# Patient Record
Sex: Male | Born: 1960 | Race: White | Hispanic: No | Marital: Married | Smoking: Former smoker
Health system: Southern US, Community
[De-identification: ages and names within clinical notes are randomized; demographics above are authoritative.]

## PROBLEM LIST (undated history)

## (undated) DIAGNOSIS — D126 Benign neoplasm of colon, unspecified: Secondary | ICD-10-CM

## (undated) DIAGNOSIS — I10 Essential (primary) hypertension: Secondary | ICD-10-CM

## (undated) DIAGNOSIS — K760 Fatty (change of) liver, not elsewhere classified: Secondary | ICD-10-CM

## (undated) DIAGNOSIS — K5790 Diverticulosis of intestine, part unspecified, without perforation or abscess without bleeding: Secondary | ICD-10-CM

## (undated) DIAGNOSIS — G43909 Migraine, unspecified, not intractable, without status migrainosus: Secondary | ICD-10-CM

## (undated) DIAGNOSIS — I2510 Atherosclerotic heart disease of native coronary artery without angina pectoris: Secondary | ICD-10-CM

## (undated) HISTORY — PX: PROSTATE SURGERY: SHX751

## (undated) HISTORY — DX: Diverticulosis of intestine, part unspecified, without perforation or abscess without bleeding: K57.90

## (undated) HISTORY — DX: Fatty (change of) liver, not elsewhere classified: K76.0

## (undated) HISTORY — DX: Atherosclerotic heart disease of native coronary artery without angina pectoris: I25.10

## (undated) HISTORY — PX: ADENOIDECTOMY: SUR15

## (undated) HISTORY — PX: HERNIA REPAIR: SHX51

## (undated) HISTORY — DX: Migraine, unspecified, not intractable, without status migrainosus: G43.909

## (undated) HISTORY — PX: VASECTOMY: SHX75

## (undated) HISTORY — DX: Benign neoplasm of colon, unspecified: D12.6

## (undated) HISTORY — DX: Essential (primary) hypertension: I10

---

## 1992-08-28 HISTORY — PX: ANKLE SURGERY: SHX546

## 2001-08-05 ENCOUNTER — Encounter: Payer: Self-pay | Admitting: General Surgery

## 2001-08-05 ENCOUNTER — Encounter: Admission: RE | Admit: 2001-08-05 | Discharge: 2001-08-05 | Payer: Self-pay | Admitting: General Surgery

## 2004-01-20 ENCOUNTER — Encounter: Admission: RE | Admit: 2004-01-20 | Discharge: 2004-01-20 | Payer: Self-pay | Admitting: Otolaryngology

## 2006-06-18 ENCOUNTER — Ambulatory Visit (HOSPITAL_COMMUNITY): Admission: RE | Admit: 2006-06-18 | Discharge: 2006-06-18 | Payer: Self-pay | Admitting: Chiropractic Medicine

## 2007-03-17 ENCOUNTER — Ambulatory Visit (HOSPITAL_COMMUNITY): Admission: RE | Admit: 2007-03-17 | Discharge: 2007-03-17 | Payer: Self-pay | Admitting: Family Medicine

## 2009-07-06 ENCOUNTER — Ambulatory Visit: Payer: Self-pay | Admitting: Cardiology

## 2009-07-06 DIAGNOSIS — I10 Essential (primary) hypertension: Secondary | ICD-10-CM | POA: Insufficient documentation

## 2009-07-06 DIAGNOSIS — R072 Precordial pain: Secondary | ICD-10-CM | POA: Insufficient documentation

## 2009-07-06 DIAGNOSIS — E669 Obesity, unspecified: Secondary | ICD-10-CM | POA: Insufficient documentation

## 2009-08-02 ENCOUNTER — Ambulatory Visit: Payer: Self-pay | Admitting: Cardiology

## 2009-08-02 ENCOUNTER — Ambulatory Visit: Payer: Self-pay

## 2010-07-05 ENCOUNTER — Encounter: Admission: RE | Admit: 2010-07-05 | Discharge: 2010-07-05 | Payer: Self-pay | Admitting: Sports Medicine

## 2010-09-17 ENCOUNTER — Encounter: Payer: Self-pay | Admitting: Sports Medicine

## 2010-09-29 NOTE — Letter (Signed)
Summary: [facility]  [facility]   Imported By: Kassie Mends 09/13/2009 08:31:29  _____________________________________________________________________  External Attachment:    Type:   Image     Comment:   External Document

## 2012-01-23 ENCOUNTER — Other Ambulatory Visit: Payer: Self-pay | Admitting: Physician Assistant

## 2012-01-23 MED ORDER — HYDROCHLOROTHIAZIDE 25 MG PO TABS: 25.0000 mg | ORAL_TABLET | Freq: Every day | ORAL | Status: DC

## 2012-10-25 ENCOUNTER — Ambulatory Visit (INDEPENDENT_AMBULATORY_CARE_PROVIDER_SITE_OTHER): Payer: BC Managed Care – PPO | Admitting: Family Medicine

## 2012-10-25 VITALS — BP 144/92 | HR 65 | Temp 98.0°F | Resp 16 | Ht 71.0 in | Wt 226.0 lb

## 2012-10-25 DIAGNOSIS — J029 Acute pharyngitis, unspecified: Secondary | ICD-10-CM

## 2012-10-25 DIAGNOSIS — J309 Allergic rhinitis, unspecified: Secondary | ICD-10-CM

## 2012-10-25 MED ORDER — FLUTICASONE PROPIONATE 50 MCG/ACT NA SUSP: 2.0000 | Freq: Every day | NASAL | Status: DC

## 2012-10-25 MED ORDER — CEFDINIR 300 MG PO CAPS: 300.0000 mg | ORAL_CAPSULE | Freq: Two times a day (BID) | ORAL | Status: DC

## 2012-10-25 NOTE — Patient Instructions (Addendum)
Use the flonase as needed for your symptoms, you may also try an OTC antihistamine such as Claritin or zyrtec.  If you are not better in the next few days please feel free to give me a call- at that time you could also start the omnicef rx if you like.     Let me know if you are getting worse or developing any other symptoms or a fever.

## 2012-10-25 NOTE — Progress Notes (Signed)
[facility] [address], [place] [address] [phone]  Date:  10/25/2012   Name:  Henry Wallace   DOB:  April 15, 1961   MRN:  [id number]  PCP:  No primary provider on file.    Chief Complaint: Sore Throat   History of Present Illness:  Henry Wallace is a 52 y.o. very pleasant male patient who presents with the following:  He is here with a ST that has been getting worse over he last 5 days.  He has tried to deal with this at home, but it is now harder to swallow and talk due to pain. Ears feel "tickly"  He has a mild cough- it comes and goes.  He is not aware of any fever, chills or aches "If my throat didn't hurt I wouldn't know I was sick."  He has noted some sinus drainage.  No particular nasal symtpoms.   Not taking any medications for his illness except for cough drops He has lost about 30 lbs through diet and exercise  Patient Active Problem List  Diagnosis  . OBESITY, UNSPECIFIED  . HYPERTENSION, BENIGN  . CHEST PAIN, PRECORDIAL    Past Medical History  Diagnosis Date  . Hypertension     Past Surgical History  Procedure Laterality Date  . Hernia repair    . Vasectomy    . Prostate surgery      History  Substance Use Topics  . Smoking status: Never Smoker   . Smokeless tobacco: Not on file  . Alcohol Use: Not on file    History reviewed. No pertinent family history.  No Known Allergies  Medication list has been reviewed and updated.  Current Outpatient Prescriptions on File Prior to Visit  Medication Sig Dispense Refill  . hydrochlorothiazide (HYDRODIURIL) 25 MG tablet Take 1 tablet (25 mg total) by mouth daily.  90 tablet  0   No current facility-administered medications on file prior to visit.    Review of Systems:  As per HPI- otherwise negative.   Physical Examination: Filed Vitals:   10/25/12 1223  BP: 144/92  Pulse: 65  Temp: 98 F (36.7 C)  Resp: 16   Filed Vitals:   10/25/12 1223  Height: 5'  11" (1.803 m)  Weight: 226 lb (102.513 kg)   Body mass index is 31.53 kg/(m^2). Ideal Body Weight: Weight in (lb) to have BMI = 25: 178.9  GEN: WDWN, NAD, Non-toxic, A & O x 3 HEENT: Atraumatic, Normocephalic. Neck supple. No masses, No LAD. Bilateral TM wnl, oropharynx normal.  PEERL,EOMI.   Nasal cavity quite inflamed and slightly congested Ears and Nose: No external deformity. CV: RRR, No M/G/R. No JVD. No thrill. No extra heart sounds. PULM: CTA B, no wheezes, crackles, rhonchi. No retractions. No resp. distress. No accessory muscle use. EXTR: No c/c/e NEURO Normal gait.  PSYCH: Normally interactive. Conversant. Not depressed or anxious appearing.  Calm demeanor.   Results for orders placed in visit on 10/25/12  POCT RAPID STREP A (OFFICE)      Result Value Range   Rapid Strep A Screen Negative  Negative    Assessment and Plan: Acute pharyngitis - Plan: POCT rapid strep A, cefdinir (OMNICEF) 300 MG capsule  Allergic rhinitis - Plan: fluticasone (FLONASE) 50 MCG/ACT nasal spray  Likely viral URI vs allergies.  Will try flonase and OTC claritin or zyrtec- if not better may fill and try omnicef rx  See patient instructions for more details.  Lamar Blinks, MD

## 2013-07-22 ENCOUNTER — Ambulatory Visit (INDEPENDENT_AMBULATORY_CARE_PROVIDER_SITE_OTHER): Payer: BC Managed Care – PPO | Admitting: Family Medicine

## 2013-07-22 ENCOUNTER — Ambulatory Visit: Payer: BC Managed Care – PPO

## 2013-07-22 VITALS — BP 138/90 | HR 66 | Temp 98.7°F | Resp 18 | Ht 70.5 in | Wt 224.8 lb

## 2013-07-22 DIAGNOSIS — S59902S Unspecified injury of left elbow, sequela: Secondary | ICD-10-CM

## 2013-07-22 DIAGNOSIS — IMO0002 Reserved for concepts with insufficient information to code with codable children: Secondary | ICD-10-CM

## 2013-07-22 DIAGNOSIS — IMO0001 Reserved for inherently not codable concepts without codable children: Secondary | ICD-10-CM

## 2013-07-22 DIAGNOSIS — F411 Generalized anxiety disorder: Secondary | ICD-10-CM

## 2013-07-22 DIAGNOSIS — T148XXA Other injury of unspecified body region, initial encounter: Secondary | ICD-10-CM

## 2013-07-22 DIAGNOSIS — Z23 Encounter for immunization: Secondary | ICD-10-CM

## 2013-07-22 MED ORDER — CITALOPRAM HYDROBROMIDE 20 MG PO TABS: 20.0000 mg | ORAL_TABLET | Freq: Every day | ORAL | Status: DC

## 2013-07-22 MED ORDER — CLONAZEPAM 0.5 MG PO TABS: 0.5000 mg | ORAL_TABLET | Freq: Three times a day (TID) | ORAL | Status: DC | PRN

## 2013-07-22 NOTE — Progress Notes (Signed)
[facility] [address], [place] [address] [phone]  Date:  07/22/2013   Name:  Henry Wallace   DOB:  1961-08-07   MRN:  [id number]  PCP:  No primary provider on file.    Chief Complaint: Anxiety, Elbow Pain and Other   History of Present Illness:  Henry Wallace is a 52 y.o. very pleasant male patient who presents with the following:  He is here today with a few issues- he planned to see [place] for an elbow problem, but then "I was feeling all these emotions and I was not sure how to handle it."   "I am falling flat apart."  He has felt this way for a few weeks.   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  He is frightened by his current feeling of loss of control and uncertainty. These feelings stem from current issues in his marriage  His wife Henry Wallace is an accomplished Engineer, structural.  They started lessons together a couple of years ago.  However he started having some joint issues which limited his participation in dance.  Henry Wallace has continued dancing on her own; her involvement has grown into a major activity which is expensive and involves spending a lot of time on practice and competition.  He estimates she has may have spent $250K on dance activities last year.  He is also suspicious of the amount of time she spends with her (male) dance partner.  Henry Wallace feels that this dance partner has taken his place in Henry Wallace's life.   Recently this issue came to head when he surprised Henry Wallace by coming to one of her competitions.  He watched his wife perform and he was disturbed by the sexuality of the dance, and since then has been unable to sleep.  They have tried to talk about this, but he does not feel that they are communicating.   He has been so upset that he has not slept in a week or so.    He also picked up a heavy box recently and thinks he may have hurt his left elbow.   He also was trying to trim up his pubic  hair in order to seem more attractive and cut himself; he is not sure if he might need stitches.  This occurred about 2 days ago.     He had bene on citalopram in the pasts; but has not taken this in months.   Patient Active Problem List   Diagnosis Date Noted  . OBESITY, UNSPECIFIED 07/06/2009  . HYPERTENSION, BENIGN 07/06/2009  . CHEST PAIN, PRECORDIAL 07/06/2009    Past Medical History  Diagnosis Date  . Hypertension     Past Surgical History  Procedure Laterality Date  . Hernia repair    . Vasectomy    . Prostate surgery      History  Substance Use Topics  . Smoking status: Never Smoker   . Smokeless tobacco: Not on file  . Alcohol Use: Not on file    History reviewed. No pertinent family history.  No Known Allergies  Medication list has been reviewed and updated.  Current Outpatient Prescriptions on File Prior to Visit  Medication Sig Dispense Refill  . 5-Hydroxytryptophan (5-HTP PO) Take 10 mg by mouth.      Marland Kitchen amLODipine (NORVASC) 10 MG tablet Take 10 mg by mouth daily.      . cefdinir (OMNICEF) 300 MG capsule  Take 1 capsule (300 mg total) by mouth 2 (two) times daily.  20 capsule  0  . citalopram (CELEXA) 20 MG tablet Take 20 mg by mouth daily.      Marland Kitchen DHEA 10 MG CAPS Take by mouth.      . fluticasone (FLONASE) 50 MCG/ACT nasal spray Place 2 sprays into the nose daily.  16 g  6  . hydrochlorothiazide (HYDRODIURIL) 25 MG tablet Take 1 tablet (25 mg total) by mouth daily.  90 tablet  0   No current facility-administered medications on file prior to visit.    Review of Systems:  As per HPI- otherwise negative.  138/90, P 66/ R 18/ 98.7/ 97% Physical Examination: There were no vitals filed for this visit. There were no vitals filed for this visit. There is no weight on file to calculate BMI. Ideal Body Weight:    GEN: WDWN, NAD, Non-toxic, A & O x 3.   Appears upset, seemed to be having a panic attack and left exam room prior to my arrival but then was  able to return to the room and talk HEENT: Atraumatic, Normocephalic. Neck supple. No masses, No LAD. Ears and Nose: No external deformity. CV: RRR, No M/G/R. No JVD. No thrill. No extra heart sounds. PULM: CTA B, no wheezes, crackles, rhonchi. No retractions. No resp. distress. No accessory muscle use. ABD: S, NT, ND, +BS. No rebound. No HSM. EXTR: No c/c/e NEURO Normal gait.  PSYCH: Normally interactive. Conversant. Not depressed or anxious appearing.  Calm demeanor.  GU: tiny wound over the scrotum which appears to have stopped bleeding and is healing ok Left elbow: mild tenderness over the medial and lateral epicondyles.  Normal ROM, normal flexion, extension, pronation and supination.   [facility] reading (PRIMARY) by  Dr. Patsy Lager. Left elbow: negative LEFT ELBOW - COMPLETE 3+ VIEW  COMPARISON: None.  FINDINGS: No acute abnormality is seen. Alignment is normal. No joint effusion is noted.  IMPRESSION: Negative.  Unsure last tetanus shot- will give today Assessment and Plan: Generalized anxiety disorder - Plan: clonazePAM (KLONOPIN) 0.5 MG tablet, citalopram (CELEXA) 20 MG tablet  Elbow injury, left, sequela - Plan: DG Elbow Complete Left  Laceration - Plan: Tdap vaccine greater than or equal to 7yo IM  Henry Wallace is here today with anxiety due to trouble in his marriage.  Admits that he as not slept and this is making things harder.  Strongly advised him to seek marriage counseling; if Henry Wallace will not go he should go alone.  Also encouraged him to try and get ahold of their finances as he is concerned that Henry Wallace is spending their savings and he does not really have an understanding of their bank account/ credit card situation.   He may use klonopin 0.5; 1 TID.  Cautioned regarding sedation, he can use a 1/2 if a whole is too strong.   Start back on celexa at 20 mg.   Recommended some good marriage counselors.   Reassured that his elbow is likely just strained.  Suspect it will be  better soon, recommended that he rest it.    Signed Abbe Amsterdam, MD

## 2013-07-22 NOTE — Patient Instructions (Signed)
Lets have you get back on the celexa at 20 mg a day. Also, you can take the clonazepam up to three times a day as needed for anxiety or sleep.   A couple of good marriage counselors are Warehouse manager and Dayton Scrape at [employer].    [facility] [address] [place], [place] [address] [phone]        Let your elbow rest, and try ice and NSAIDs as needed.  I think it will get better soon.

## 2013-08-11 ENCOUNTER — Telehealth: Payer: Self-pay

## 2013-08-11 DIAGNOSIS — F410 Panic disorder [episodic paroxysmal anxiety] without agoraphobia: Secondary | ICD-10-CM

## 2013-08-11 MED ORDER — ALPRAZOLAM 0.25 MG PO TABS: 0.2500 mg | ORAL_TABLET | Freq: Three times a day (TID) | ORAL | Status: DC | PRN

## 2013-08-11 NOTE — Telephone Encounter (Signed)
Patient states that he is calling to give Dr. Patsy Lager an update and would like for her to give him a call back if possible. Patient states he has also lost the information for a referral appointment. Can he have this again?  [phone]

## 2013-08-11 NOTE — Telephone Encounter (Signed)
You recommended marriage counselors but he has lost information.

## 2013-08-11 NOTE — Telephone Encounter (Signed)
Patient called again. States he really needs to hear back from Dr Patsy Lager directly. Says he has anxiety and needs something to calm him down because he is about to lose his wife and family. The office we referred him to doesn't have any opening for at least a month.

## 2013-08-11 NOTE — Telephone Encounter (Signed)
Called him back- he has been in touch with a counselor but so far does not have an appt.  Admits he has not called back to request a sooner appt but he plans to do so.  He is taking his celexa.  He does not feel that the klonopin agrees very well with him and causes excess drowsiness.  However his wife did give him a xanax a couple of times that did seem to help him.  He thinks these were the 0.25mg  tablets.  Advised him I will call these in for him.  He will let me know if he needs anything else

## 2013-10-20 ENCOUNTER — Telehealth: Payer: Self-pay

## 2013-10-20 NOTE — Telephone Encounter (Signed)
Patient's wife called and states that they are going out of the country on Saturday and he needs a refill on 2 medication. Did not catch which medications he needs refilled. Please call Author Hatlestad for this information.  [phone]

## 2013-10-21 NOTE — Telephone Encounter (Signed)
We last wrote him for HCTZ in 2013. We have not written for his Norvasc since moving to epic. There is no bloodwrok in epic, meaning I have no recent renal function. The patient knew he was going out of the country. RTC.

## 2013-10-21 NOTE — Telephone Encounter (Signed)
Pt saw Dr Lorelei Pont for other issues in Nov but don't see that BP was discussed. Pt is out of his BP meds and would like to get this approved today if a PA can authorize the RFs. Pt prefers 90 day supply but If not, I can send in a 30 day supply per protocol until Dr Lorelei Pont can review.

## 2013-10-21 NOTE — Telephone Encounter (Signed)
LMOM to CB. 

## 2013-10-22 ENCOUNTER — Ambulatory Visit (INDEPENDENT_AMBULATORY_CARE_PROVIDER_SITE_OTHER): Payer: BC Managed Care – PPO | Admitting: Family Medicine

## 2013-10-22 VITALS — BP 145/80 | HR 58 | Temp 97.9°F | Resp 16 | Ht 69.0 in | Wt 207.0 lb

## 2013-10-22 DIAGNOSIS — F411 Generalized anxiety disorder: Secondary | ICD-10-CM

## 2013-10-22 DIAGNOSIS — I10 Essential (primary) hypertension: Secondary | ICD-10-CM

## 2013-10-22 LAB — COMPREHENSIVE METABOLIC PANEL
ALBUMIN: 4.9 g/dL (ref 3.5–5.2)
ALK PHOS: 65 U/L (ref 39–117)
ALT: 20 U/L (ref 0–53)
AST: 23 U/L (ref 0–37)
BUN: 17 mg/dL (ref 6–23)
CO2: 30 mEq/L (ref 19–32)
Calcium: 9.8 mg/dL (ref 8.4–10.5)
Chloride: 100 mEq/L (ref 96–112)
Creat: 0.95 mg/dL (ref 0.50–1.35)
Glucose, Bld: 102 mg/dL — ABNORMAL HIGH (ref 70–99)
POTASSIUM: 4.2 meq/L (ref 3.5–5.3)
Sodium: 139 mEq/L (ref 135–145)
TOTAL PROTEIN: 6.9 g/dL (ref 6.0–8.3)
Total Bilirubin: 0.6 mg/dL (ref 0.2–1.2)

## 2013-10-22 MED ORDER — HYDROCHLOROTHIAZIDE 25 MG PO TABS: 25.0000 mg | ORAL_TABLET | Freq: Every day | ORAL | Status: DC

## 2013-10-22 MED ORDER — AMLODIPINE BESYLATE 10 MG PO TABS: 10.0000 mg | ORAL_TABLET | Freq: Every day | ORAL | Status: DC

## 2013-10-22 NOTE — Progress Notes (Signed)
[facility] [address], [place] [address] [phone]  Date:  10/22/2013   Name:  Henry Wallace   DOB:  03/11/1961   MRN:  [id number]  PCP:  No primary provider on file.    Chief Complaint: Medication Refill   History of Present Illness:  Henry Wallace is a 53 y.o. very pleasant male patient who presents with the following:  He is here today for a follow-up visit.  He did take just one xanax after our last visit.  He did see a psychiatrist he cannot recall her name right now.  He saw her a few times  He and his wife are still together.  Luckily he and [name] seem to be working things out, and he is no longer so suspicious of her behavior.  Hey are in some financial trouble due to expenses for her dance performances, but otherwise they are doing ok.  They have been married for nearly 30 years so he wants to keep the marriage together.    He does need refills of his BP medications today.  He has been out of his HCTZ for one day.   They are going on a trip to the [place] in a few days.  He is excited that they are taking a trip together.    Usually his home BP is 128/75  Wt Readings from Last 3 Encounters:  10/22/13 207 lb (93.895 kg)  07/22/13 224 lb 12.8 oz (101.969 kg)  10/25/12 226 lb (102.513 kg)      Patient Active Problem List   Diagnosis Date Noted  . OBESITY, UNSPECIFIED 07/06/2009  . HYPERTENSION, BENIGN 07/06/2009  . CHEST PAIN, PRECORDIAL 07/06/2009    Past Medical History  Diagnosis Date  . Hypertension     Past Surgical History  Procedure Laterality Date  . Hernia repair    . Vasectomy    . Prostate surgery      History  Substance Use Topics  . Smoking status: Never Smoker   . Smokeless tobacco: Not on file  . Alcohol Use: Not on file    No family history on file.  No Known Allergies  Medication list has been reviewed and updated.  Current Outpatient Prescriptions on File Prior to Visit  Medication Sig Dispense  Refill  . 5-Hydroxytryptophan (5-HTP PO) Take 10 mg by mouth.      . ALPRAZolam (XANAX) 0.25 MG tablet Take 1 tablet (0.25 mg total) by mouth 3 (three) times daily as needed for anxiety.  30 tablet  0  . amLODipine (NORVASC) 10 MG tablet Take 10 mg by mouth daily.      Marland Kitchen DHEA 10 MG CAPS Take by mouth.      . fluticasone (FLONASE) 50 MCG/ACT nasal spray Place 2 sprays into the nose daily.  16 g  6  . citalopram (CELEXA) 20 MG tablet Take 1 tablet (20 mg total) by mouth daily.  30 tablet  5  . hydrochlorothiazide (HYDRODIURIL) 25 MG tablet Take 1 tablet (25 mg total) by mouth daily.  90 tablet  0   No current facility-administered medications on file prior to visit.    Review of Systems: As per HPI- otherwise negative.  Physical Examination: Filed Vitals:   10/22/13 1521  BP: 145/80  Pulse: 58  Temp: 97.9 F (36.6 C)  Resp: 16   Filed Vitals:   10/22/13 1521  Height: 5\' 9"  (1.753 m)  Weight: 207 lb (93.895 kg)  Body mass index is 30.55 kg/(m^2). Ideal Body Weight: Weight in (lb) to have BMI = 25: 168.9  GEN: WDWN, NAD, Non-toxic, A & O x 3, looks well and calm today.  HEENT: Atraumatic, Normocephalic. Neck supple. No masses, No LAD. Ears and Nose: No external deformity. CV: RRR, No M/G/R. No JVD. No thrill. No extra heart sounds. PULM: CTA B, no wheezes, crackles, rhonchi. No retractions. No resp. distress. No accessory muscle use. EXTR: No c/c/e NEURO Normal gait.  PSYCH: Normally interactive. Conversant. Not depressed or anxious appearing.  Calm demeanor.    Assessment and Plan: HTN (hypertension) - Plan: amLODipine (NORVASC) 10 MG tablet, hydrochlorothiazide (HYDRODIURIL) 25 MG tablet, Comprehensive metabolic panel  Anxiety state, unspecified  Refilled BP medications today.  At home his BP is looking good.  Will be in touch regarding his labs.   He declines the flu shot today.   He is doing much better from an anxiety standpoint and hopes to work things out in his  marriage.  We wish him well  Signed Lamar Blinks, MD

## 2013-10-22 NOTE — Telephone Encounter (Signed)
Pt will be coming in this evening for his blood work and to get refills.

## 2013-10-22 NOTE — Patient Instructions (Signed)
Great job on your weight loss.  I will be in touch regarding your labs as soon as they come in.

## 2013-10-23 ENCOUNTER — Encounter: Payer: Self-pay | Admitting: Family Medicine

## 2013-11-15 ENCOUNTER — Ambulatory Visit (INDEPENDENT_AMBULATORY_CARE_PROVIDER_SITE_OTHER): Payer: BC Managed Care – PPO | Admitting: Family Medicine

## 2013-11-15 VITALS — BP 142/88 | HR 68 | Temp 97.7°F | Resp 18 | Ht 69.5 in | Wt 211.4 lb

## 2013-11-15 DIAGNOSIS — Z7185 Encounter for immunization safety counseling: Secondary | ICD-10-CM

## 2013-11-15 DIAGNOSIS — Z7189 Other specified counseling: Secondary | ICD-10-CM

## 2013-11-15 DIAGNOSIS — J329 Chronic sinusitis, unspecified: Secondary | ICD-10-CM

## 2013-11-15 DIAGNOSIS — J309 Allergic rhinitis, unspecified: Secondary | ICD-10-CM

## 2013-11-15 MED ORDER — FLUTICASONE PROPIONATE 50 MCG/ACT NA SUSP: 2.0000 | Freq: Every day | NASAL | Status: DC

## 2013-11-15 MED ORDER — LEVOFLOXACIN 500 MG PO TABS: 500.0000 mg | ORAL_TABLET | Freq: Every day | ORAL | Status: DC

## 2013-11-15 NOTE — Progress Notes (Signed)
This chart was scribed for Henry Haber, MD by Henry Wallace, Medical Scribe. This patient's care was started at 4:05 PM   Patient ID: Henry Wallace MRN: [id number], DOB: [birth year], 53 y.o. Date of Encounter: 11/15/2013, 4:05 PM  Primary Physician: No primary provider on file.  Chief Complaint: cough, sore throat, otalgia, congestion, fatigue  HPI: 53 y.o. year old male with history below presents with producitve cough, sore throat, otalgia, congestion, fatigue, onset 2 weeks ago. Reports he has also gained 6 lbs since becoming ill. Is an active runner and has not been able to given current symptoms. Pt states he was in [place] for a business trip 2 weeks ago and at the end of the first week began to feel bad. Pt and his wife were seen in an UC in [place] for flu like symptoms. Was diagnosed with Influenza B. Wife was also diagnosed with the same illness. States he spent the majority of last week in the hotel room in [place] trying to recover. Pt has served in [employer].  Past Medical History  Diagnosis Date   Hypertension      Home Meds: Prior to Admission medications   Medication Sig Start Date End Date Taking? Authorizing Provider  5-Hydroxytryptophan (5-HTP PO) Take 10 mg by mouth.   Yes Historical Provider, MD  ALPRAZolam (XANAX) 0.25 MG tablet Take 1 tablet (0.25 mg total) by mouth 3 (three) times daily as needed for anxiety. 08/11/13  Yes Gay Filler Copland, MD  amLODipine (NORVASC) 10 MG tablet Take 1 tablet (10 mg total) by mouth daily. 10/22/13  Yes Darreld Mclean, MD  DHEA 10 MG CAPS Take by mouth.   Yes Historical Provider, MD  fluticasone (FLONASE) 50 MCG/ACT nasal spray Place 2 sprays into the nose daily. 10/25/12  Yes Gay Filler Copland, MD  hydrochlorothiazide (HYDRODIURIL) 25 MG tablet Take 1 tablet (25 mg total) by mouth daily. 10/22/13 10/22/14 Yes Darreld Mclean, MD    Allergies: No Known Allergies  History   Social History   Marital Status:  Married    Spouse Name: N/A    Number of Children: N/A   Years of Education: N/A   Occupational History   Not on file.   Social History Main Topics   Smoking status: Never Smoker    Smokeless tobacco: Not on file   Alcohol Use: Not on file   Drug Use: Not on file   Sexual Activity: Not on file   Other Topics Concern   Not on file   Social History Narrative   No narrative on file     Review of Systems: Constitutional: negative for chills, fever, night sweats, weight changes. Positive for fatigue  HEENT: negative for vision changes, hearing loss, rhinorrhea, ST, epistaxis, or sinus pressure. Positive for sore throat, congestion and otalgia. Cardiovascular: negative for chest pain or palpitations Respiratory: negative for hemoptysis, wheezing, shortness of breath. Positive for cough Abdominal: negative for abdominal pain, nausea, vomiting, diarrhea, or constipation Dermatological: negative for rash Neurologic: negative for headache, dizziness, or syncope All other systems reviewed and are otherwise negative with the exception to those above and in the HPI.   Physical Exam: Blood pressure 142/88, pulse 68, temperature 97.7 F (36.5 C), temperature source Oral, resp. rate 18, height 5' 9.5" (1.765 m), weight 211 lb 6.4 oz (95.89 kg), SpO2 95.00%., Body mass index is 30.78 kg/(m^2). General: Well developed, well nourished, in no acute distress. Head: Normocephalic, atraumatic, eyes without discharge, sclera non-icteric, nares are  without discharge. Bilateral auditory canals clear, TM's are without perforation, pearly grey and translucent with reflective cone of light bilaterally. Oral cavity moist, posterior pharynx without exudate, erythema, peritonsillar abscess, or post nasal drip.  Neck: Supple. No thyromegaly. Full ROM. No lymphadenopathy. Lungs: Clear bilaterally to auscultation without wheezes, rales, or rhonchi. Breathing is unlabored. Heart: RRR with S1 S2. No  murmurs, rubs, or gallops appreciated. Abdomen: Soft, non-tender, non-distended with normoactive bowel sounds. No hepatomegaly. No rebound/guarding. No obvious abdominal masses. Msk:  Strength and tone normal for age. Extremities/Skin: Warm and dry. No clubbing or cyanosis. No edema. No rashes or suspicious lesions. Neuro: Alert and oriented X 3. Moves all extremities spontaneously. Gait is normal. CNII-XII grossly in tact. Psych:  Responds to questions appropriately with a normal affect.     ASSESSMENT AND PLAN:  53 y.o. year old male with sinusitis, need for immunization review (zostavax) and need for permission to SCUBA dive Sinusitis - Plan: levofloxacin (LEVAQUIN) 500 MG tablet, fluticasone (FLONASE) 50 MCG/ACT nasal spray  Allergic rhinitis - Plan: fluticasone (FLONASE) 50 MCG/ACT nasal spray  Encounter for counseling regarding immunization - Plan: Varicella-zoster vaccine subcutaneous    Signed, Henry Haber, MD 11/15/2013 4:05 PM

## 2014-01-28 ENCOUNTER — Telehealth: Payer: Self-pay

## 2014-01-28 ENCOUNTER — Other Ambulatory Visit: Payer: Self-pay | Admitting: Family Medicine

## 2014-01-28 NOTE — Telephone Encounter (Signed)
Pt would like a refill on all of his medications. Best# [phone]    Pharmacy: [facility]

## 2014-01-28 NOTE — Telephone Encounter (Signed)
Pt has refills at pharmacy- he is going to call them to get refill taken care of.

## 2014-01-28 NOTE — Telephone Encounter (Signed)
Lm for pt- which medications does he need refilled?

## 2014-04-28 ENCOUNTER — Other Ambulatory Visit: Payer: Self-pay | Admitting: Physician Assistant

## 2014-05-08 ENCOUNTER — Telehealth: Payer: Self-pay

## 2014-05-08 NOTE — Telephone Encounter (Signed)
pts wife angela would like to know if pt can have a refill on c

## 2014-05-14 ENCOUNTER — Other Ambulatory Visit: Payer: Self-pay | Admitting: Family Medicine

## 2014-06-12 ENCOUNTER — Telehealth: Payer: Self-pay

## 2014-06-12 DIAGNOSIS — Z131 Encounter for screening for diabetes mellitus: Secondary | ICD-10-CM

## 2014-06-12 DIAGNOSIS — Z130 Encounter for screening for diseases of the blood and blood-forming organs and certain disorders involving the immune mechanism: Secondary | ICD-10-CM

## 2014-06-12 DIAGNOSIS — Z125 Encounter for screening for malignant neoplasm of prostate: Secondary | ICD-10-CM

## 2014-06-12 DIAGNOSIS — Z13220 Encounter for screening for lipoid disorders: Secondary | ICD-10-CM

## 2014-06-12 DIAGNOSIS — Z1329 Encounter for screening for other suspected endocrine disorder: Secondary | ICD-10-CM

## 2014-06-12 NOTE — Telephone Encounter (Signed)
Dr.Copland, Pts wife [name] would like to discuss labs she would like to have done for her husbands upcoming appointment. Best# [phone]

## 2014-06-12 NOTE — Telephone Encounter (Signed)
Called her back- her husband is scheduled for a physical coming up.  They would like to have labs drawn prior to appt.  I will order labs for them to have drawn in advance- fasting

## 2014-06-22 ENCOUNTER — Encounter: Payer: BC Managed Care – PPO | Admitting: Family Medicine

## 2014-06-29 ENCOUNTER — Ambulatory Visit (INDEPENDENT_AMBULATORY_CARE_PROVIDER_SITE_OTHER): Payer: BC Managed Care – PPO | Admitting: Family Medicine

## 2014-06-29 ENCOUNTER — Encounter: Payer: Self-pay | Admitting: Family Medicine

## 2014-06-29 VITALS — BP 136/90 | HR 65 | Temp 98.6°F | Resp 16 | Ht 70.0 in | Wt 228.0 lb

## 2014-06-29 DIAGNOSIS — Z125 Encounter for screening for malignant neoplasm of prostate: Secondary | ICD-10-CM

## 2014-06-29 DIAGNOSIS — Z131 Encounter for screening for diabetes mellitus: Secondary | ICD-10-CM

## 2014-06-29 DIAGNOSIS — Z13220 Encounter for screening for lipoid disorders: Secondary | ICD-10-CM

## 2014-06-29 DIAGNOSIS — Z1329 Encounter for screening for other suspected endocrine disorder: Secondary | ICD-10-CM

## 2014-06-29 DIAGNOSIS — Z1211 Encounter for screening for malignant neoplasm of colon: Secondary | ICD-10-CM

## 2014-06-29 DIAGNOSIS — Z23 Encounter for immunization: Secondary | ICD-10-CM

## 2014-06-29 DIAGNOSIS — Z0000 Encounter for general adult medical examination without abnormal findings: Secondary | ICD-10-CM

## 2014-06-29 DIAGNOSIS — Z130 Encounter for screening for diseases of the blood and blood-forming organs and certain disorders involving the immune mechanism: Secondary | ICD-10-CM

## 2014-06-29 LAB — CBC
HCT: 43.4 % (ref 39.0–52.0)
Hemoglobin: 15.5 g/dL (ref 13.0–17.0)
MCH: 31.6 pg (ref 26.0–34.0)
MCHC: 35.7 g/dL (ref 30.0–36.0)
MCV: 88.6 fL (ref 78.0–100.0)
PLATELETS: 228 10*3/uL (ref 150–400)
RBC: 4.9 MIL/uL (ref 4.22–5.81)
RDW: 13.1 % (ref 11.5–15.5)
WBC: 6.6 10*3/uL (ref 4.0–10.5)

## 2014-06-29 LAB — COMPREHENSIVE METABOLIC PANEL
ALT: 38 U/L (ref 0–53)
AST: 27 U/L (ref 0–37)
Albumin: 4.8 g/dL (ref 3.5–5.2)
Alkaline Phosphatase: 71 U/L (ref 39–117)
BILIRUBIN TOTAL: 0.7 mg/dL (ref 0.2–1.2)
BUN: 12 mg/dL (ref 6–23)
CHLORIDE: 101 meq/L (ref 96–112)
CO2: 30 meq/L (ref 19–32)
CREATININE: 1.12 mg/dL (ref 0.50–1.35)
Calcium: 9.5 mg/dL (ref 8.4–10.5)
Glucose, Bld: 100 mg/dL — ABNORMAL HIGH (ref 70–99)
Potassium: 4.4 mEq/L (ref 3.5–5.3)
Sodium: 139 mEq/L (ref 135–145)
Total Protein: 7 g/dL (ref 6.0–8.3)

## 2014-06-29 LAB — LIPID PANEL
CHOL/HDL RATIO: 3.3 ratio
CHOLESTEROL: 157 mg/dL (ref 0–200)
HDL: 47 mg/dL (ref >39)
LDL Cholesterol: 96 mg/dL (ref 0–99)
Triglycerides: 72 mg/dL (ref <150)
VLDL: 14 mg/dL (ref 0–40)

## 2014-06-29 LAB — TSH: TSH: 2.512 u[IU]/mL (ref 0.350–4.500)

## 2014-06-29 MED ORDER — CITALOPRAM HYDROBROMIDE 20 MG PO TABS: ORAL_TABLET | ORAL | Status: DC

## 2014-06-29 NOTE — Patient Instructions (Signed)
Good to see you today. I will get you referred for a colonoscopy.  I will send you a copy of your labs in the mail with notes and will call you if there is any more urgent concern.  Take care!

## 2014-06-29 NOTE — Progress Notes (Signed)
[facility] [address], [place] [address] [phone]  Date:  06/29/2014   Name:  EVANS LEVEE   DOB:  [birth year]   MRN:  [id number]  PCP:  No primary care provider on file.    Chief Complaint: Annual Exam and Medication Refill   History of Present Illness:  Henry Wallace is a 53 y.o. very pleasant male patient who presents with the following:  History of anxiety and HTN.  Here today for a physical exam.  Labs not drawn yet but ordered- he is not sure but was told that he could not have this drawn in advance.   He is fasting today, no meds either. He is  Tdap is UTD.   He is taking celexa for his mood; it does seem to help him and he would like to continue to take it. He takes the xanax very rarely- he has only taken 2 or 3 in total and still has plenty He has not been able to get his colonoscopy done yet as it has been canceled on him several times.   He would like to do a flu shot today  Wt Readings from Last 3 Encounters:  06/29/14 228 lb (103.42 kg)  11/15/13 211 lb 6.4 oz (95.89 kg)  10/22/13 207 lb (93.895 kg)    Patient Active Problem List   Diagnosis Date Noted  . OBESITY, UNSPECIFIED 07/06/2009  . HYPERTENSION, BENIGN 07/06/2009  . CHEST PAIN, PRECORDIAL 07/06/2009    Past Medical History  Diagnosis Date  . Hypertension     Past Surgical History  Procedure Laterality Date  . Hernia repair    . Vasectomy    . Prostate surgery      History  Substance Use Topics  . Smoking status: Never Smoker   . Smokeless tobacco: Not on file  . Alcohol Use: Not on file    No family history on file.  No Known Allergies  Medication list has been reviewed and updated.  Current Outpatient Prescriptions on File Prior to Visit  Medication Sig Dispense Refill  . 5-Hydroxytryptophan (5-HTP PO) Take 10 mg by mouth.    . ALPRAZolam (XANAX) 0.25 MG tablet Take 1 tablet (0.25 mg total) by mouth 3 (three) times daily as needed for anxiety. 30  tablet 0  . amLODipine (NORVASC) 10 MG tablet Take 1 tablet (10 mg total) by mouth daily. 90 tablet 3  . citalopram (CELEXA) 20 MG tablet TAKE 1 TABLET BY MOUTH DAILY. NEED OFFICE VISIT FOR ADDITIONAL REFILLS 30 tablet 0  . DHEA 10 MG CAPS Take by mouth.    . fluticasone (FLONASE) 50 MCG/ACT nasal spray Place 2 sprays into both nostrils daily. 16 g 11  . hydrochlorothiazide (HYDRODIURIL) 25 MG tablet Take 1 tablet (25 mg total) by mouth daily. 90 tablet 3   No current facility-administered medications on file prior to visit.    Review of Systems:  As per HPI- otherwise negative.   Physical Examination: Filed Vitals:   06/29/14 0948  BP: 136/90  Pulse: 65  Temp: 98.6 F (37 C)  Resp: 16   Filed Vitals:   06/29/14 0948  Height: 5\' 10"  (1.778 m)  Weight: 228 lb (103.42 kg)   Body mass index is 32.71 kg/(m^2). Ideal Body Weight: Weight in (lb) to have BMI = 25: 173.9  GEN: WDWN, NAD, Non-toxic, A & O x 3, overweight/ large build.  Looks well  HEENT: Atraumatic, Normocephalic. Neck supple. No masses,  No LAD.  Bilateral TM wnl, oropharynx normal.  PEERL,EOMI.   Ears and Nose: No external deformity. CV: RRR, No M/G/R. No JVD. No thrill. No extra heart sounds. PULM: CTA B, no wheezes, crackles, rhonchi. No retractions. No resp. distress. No accessory muscle use. ABD: S, NT, ND, +BS. No rebound. No HSM. EXTR: No c/c/e NEURO Normal gait.  PSYCH: Normally interactive. Conversant. Not depressed or anxious appearing.  Calm demeanor.  GU: normal testicles and penis.  Defer DRE as we are doing a PSA  Assessment and Plan: Physical exam, annual  Immunization due - Plan: POCT Influenza A/B  Screening for colon cancer - Plan: Ambulatory referral to Gastroenterology  Screening, anemia, deficiency, iron - Plan: CBC  Screening for diabetes mellitus - Plan: Comprehensive metabolic panel  Screening for hyperlipidemia - Plan: Lipid panel  Screening for prostate cancer - Plan:  PSA  Screening for hypothyroidism - Plan: TSH  CPE as above. For some reason he has not been able to get a colonoscopy yet as his test has been canceled numerous times.  Will do a referral to GI Flu shot and fasting labs today Will be in touch with his labs   Signed Lamar Blinks, MD

## 2014-06-30 ENCOUNTER — Encounter: Payer: Self-pay | Admitting: Family Medicine

## 2014-06-30 LAB — PSA: PSA: 1.42 ng/mL (ref <=4.00)

## 2014-07-01 ENCOUNTER — Telehealth: Payer: Self-pay | Admitting: *Deleted

## 2014-07-01 NOTE — Telephone Encounter (Signed)
Spoken to Patient. Inform him lab are good and a copy will be mailed out to him.

## 2014-11-02 ENCOUNTER — Other Ambulatory Visit: Payer: Self-pay | Admitting: Family Medicine

## 2014-11-04 ENCOUNTER — Telehealth: Payer: Self-pay

## 2014-11-04 NOTE — Telephone Encounter (Signed)
Spoke with pt's wife, advised message from Dr. Tamala Julian. She would also like a Rx Henry Wallace Dec 03, 1960 her husband because he will be with her and they are traveling out of the country. Is this possible, please advise.

## 2014-11-04 NOTE — Telephone Encounter (Signed)
Spoke with pt, advised message from Dr. Tamala Julian. She would also like a Rx Henry Wallace 05-Apr-1961 her husband because he will be with her and they are traveling out of the country. Is this possible, please advise. She stated her husband is her care giver and she depends on him to be well to take care of her. Per spouse they are leaving 7 am tomorrow morning and requesting it to be sent today. She is requesting it to go [facility] on [address] and her call back number is [phone].

## 2014-11-05 MED ORDER — OSELTAMIVIR PHOSPHATE 75 MG PO CAPS: 75.0000 mg | ORAL_CAPSULE | Freq: Every day | ORAL | Status: DC

## 2014-11-05 NOTE — Telephone Encounter (Signed)
Call --- rx sent to pharmacy for Henry Wallace for Tamiflu.

## 2014-11-06 NOTE — Telephone Encounter (Signed)
Spoke with pt, advised Tamiflu was sent into the pharmacy. He said it too late. Apologized to pt.

## 2014-11-13 ENCOUNTER — Other Ambulatory Visit: Payer: Self-pay | Admitting: Family Medicine

## 2014-11-23 ENCOUNTER — Telehealth: Payer: Self-pay

## 2014-11-23 DIAGNOSIS — F32A Depression, unspecified: Secondary | ICD-10-CM

## 2014-11-23 DIAGNOSIS — I10 Essential (primary) hypertension: Secondary | ICD-10-CM

## 2014-11-23 DIAGNOSIS — F329 Major depressive disorder, single episode, unspecified: Secondary | ICD-10-CM

## 2014-11-23 NOTE — Telephone Encounter (Addendum)
[name] states her husband take HYDRODIURIL 25, NORVASC [place] 20mg s, he is completely out and was told by the Dr, he would get a year's supply until his next CPE but it isn't until November Please call [phone]  Need a 2 day supply     [place]

## 2014-11-24 MED ORDER — CITALOPRAM HYDROBROMIDE 20 MG PO TABS: ORAL_TABLET | ORAL | Status: DC

## 2014-11-24 MED ORDER — HYDROCHLOROTHIAZIDE 25 MG PO TABS: ORAL_TABLET | ORAL | Status: DC

## 2014-11-24 MED ORDER — AMLODIPINE BESYLATE 10 MG PO TABS: 10.0000 mg | ORAL_TABLET | Freq: Every day | ORAL | Status: DC

## 2014-11-24 NOTE — Telephone Encounter (Signed)
Taken care of for one year.

## 2014-11-24 NOTE — Telephone Encounter (Signed)
I will refill 90 days supply for each.but I cannot Rx for more than 3 months. Can I refill until appt. Please advise.

## 2015-02-23 ENCOUNTER — Ambulatory Visit: Payer: Self-pay | Admitting: Physician Assistant

## 2015-08-25 ENCOUNTER — Ambulatory Visit (INDEPENDENT_AMBULATORY_CARE_PROVIDER_SITE_OTHER): Payer: BLUE CROSS/BLUE SHIELD

## 2015-08-25 DIAGNOSIS — Z23 Encounter for immunization: Secondary | ICD-10-CM | POA: Diagnosis not present

## 2015-11-03 ENCOUNTER — Telehealth: Payer: Self-pay | Admitting: *Deleted

## 2015-11-03 NOTE — Telephone Encounter (Signed)
Pt traveling at time of call and unable to participate in pre-visit information.

## 2015-11-04 ENCOUNTER — Ambulatory Visit (INDEPENDENT_AMBULATORY_CARE_PROVIDER_SITE_OTHER): Payer: BLUE CROSS/BLUE SHIELD | Admitting: Family Medicine

## 2015-11-04 ENCOUNTER — Encounter: Payer: Self-pay | Admitting: Family Medicine

## 2015-11-04 VITALS — BP 130/90 | HR 67 | Temp 98.0°F | Ht 70.0 in | Wt 226.0 lb

## 2015-11-04 DIAGNOSIS — F32A Depression, unspecified: Secondary | ICD-10-CM

## 2015-11-04 DIAGNOSIS — Z125 Encounter for screening for malignant neoplasm of prostate: Secondary | ICD-10-CM

## 2015-11-04 DIAGNOSIS — F3342 Major depressive disorder, recurrent, in full remission: Secondary | ICD-10-CM

## 2015-11-04 DIAGNOSIS — Z0000 Encounter for general adult medical examination without abnormal findings: Secondary | ICD-10-CM

## 2015-11-04 DIAGNOSIS — Z131 Encounter for screening for diabetes mellitus: Secondary | ICD-10-CM

## 2015-11-04 DIAGNOSIS — F329 Major depressive disorder, single episode, unspecified: Secondary | ICD-10-CM

## 2015-11-04 DIAGNOSIS — Z130 Encounter for screening for diseases of the blood and blood-forming organs and certain disorders involving the immune mechanism: Secondary | ICD-10-CM

## 2015-11-04 DIAGNOSIS — Z13220 Encounter for screening for lipoid disorders: Secondary | ICD-10-CM

## 2015-11-04 DIAGNOSIS — I10 Essential (primary) hypertension: Secondary | ICD-10-CM

## 2015-11-04 MED ORDER — CITALOPRAM HYDROBROMIDE 20 MG PO TABS: ORAL_TABLET | ORAL | Status: DC

## 2015-11-04 MED ORDER — AMLODIPINE BESYLATE 10 MG PO TABS: 10.0000 mg | ORAL_TABLET | Freq: Every day | ORAL | Status: DC

## 2015-11-04 MED ORDER — HYDROCHLOROTHIAZIDE 25 MG PO TABS: ORAL_TABLET | ORAL | Status: DC

## 2015-11-04 NOTE — Progress Notes (Signed)
[place] at [facility] [address] [place], [place] [address] [phone]  Date:  11/04/2015   Name:  Henry Wallace   DOB:  1960/12/01   MRN:  [id number]  PCP:  Lamar Blinks, MD    Chief Complaint: Annual Exam   History of Present Illness:  Henry Wallace is a 55 y.o. very pleasant male patient who presents with the following:  Here today for a CPE History of obesity, HTN, depression Colonoscopy is UTD, immunizations are UTD Annual PSA has looked fine.  Most recent labs in 06/2014  He did have surgery over thanksgiving- major open left shoulder repair.  He continues to have some pain - no longer doing formal PT, he is doing this at home.  He is frustrated by his continued pain and wants to know if he will continue to get better/ is his progress normal. He does feel that his shoulder is steadily improving  His BP is controlled, no SE such as HA His depression is well controlled with celexa.    He wonders about zostavax but we are not sure if this will be covered by insurance  He is married, one son Henry Wallace who is going through some difficulty right now- this is a stressor Patient Active Problem List   Diagnosis Date Noted  . OBESITY, UNSPECIFIED 07/06/2009  . HYPERTENSION, BENIGN 07/06/2009  . CHEST PAIN, PRECORDIAL 07/06/2009    Past Medical History  Diagnosis Date  . Hypertension     Past Surgical History  Procedure Laterality Date  . Hernia repair    . Vasectomy    . Prostate surgery      Social History  Substance Use Topics  . Smoking status: Never Smoker   . Smokeless tobacco: None  . Alcohol Use: 0.0 oz/week    0 Standard drinks or equivalent per week     Comment: 8    Family History  Problem Relation Age of Onset  . Diabetes Maternal Grandmother   . Cancer Maternal Grandfather   . Heart disease Paternal Grandfather     No Known Allergies  Medication list has been reviewed and  updated.  Current Outpatient Prescriptions on File Prior to Visit  Medication Sig Dispense Refill  . amLODipine (NORVASC) 10 MG tablet Take 1 tablet (10 mg total) by mouth daily. 90 tablet 3  . citalopram (CELEXA) 20 MG tablet TAKE 1 TABLET BY MOUTH DAILY. 90 tablet 3  . DHEA 10 MG CAPS Take by mouth.    . hydrochlorothiazide (HYDRODIURIL) 25 MG tablet TAKE 1 TABLET BY MOUTH EVERY DAY. 90 tablet 3  . OVER THE COUNTER MEDICATION Adaptacin taking daily    . OVER THE COUNTER MEDICATION Vitamin D 3 5000 mg taking daily    . OVER THE COUNTER MEDICATION OTC Super B taking daily     No current facility-administered medications on file prior to visit.    Review of Systems:  As per HPI- otherwise negative.   Physical Examination: Filed Vitals:   11/04/15 1431  BP: 130/90  Pulse: 67  Temp: 98 F (36.7 C)   Filed Vitals:   11/04/15 1431  Height: 5\' 10"  (1.778 m)  Weight: 226 lb (102.513 kg)   Body mass index is 32.43 kg/(m^2). Ideal Body Weight: Weight in (lb) to have BMI = 25: 173.9  GEN: WDWN, NAD, Non-toxic, A & O x 3, overweight/ muscular build, looks well HEENT: Atraumatic, Normocephalic. Neck supple. No masses,  No LAD.  Bilateral TM wnl, oropharynx normal.  PEERL,EOMI.   Ears and Nose: No external deformity. CV: RRR, No M/G/R. No JVD. No thrill. No extra heart sounds. PULM: CTA B, no wheezes, crackles, rhonchi. No retractions. No resp. distress. No accessory muscle use. ABD: S, NT, ND. No rebound. No HSM. EXTR: No c/c/e NEURO Normal gait.  PSYCH: Normally interactive. Conversant. Not depressed or anxious appearing.  Calm demeanor.  Left shoulder- well healed incision. He has about 110 of flexion, abduction to 90, poor internal and external rotation   Assessment and Plan: Physical exam  Screening for prostate cancer - Plan: PSA  Screening for diabetes mellitus - Plan: Comprehensive metabolic panel, Hemoglobin A1c  Screening for hyperlipidemia - Plan: Lipid  panel  Essential hypertension - Plan: amLODipine (NORVASC) 10 MG tablet, hydrochlorothiazide (HYDRODIURIL) 25 MG tablet  Screening for deficiency anemia - Plan: CBC  Depression - Plan: citalopram (CELEXA) 20 MG tablet  Refilled his medications- doing well on current meds for HTN and depression Screening labs ordered today- he will come back when fasting. He will also ask [facility] about his zostavax coverage  Discussed his recent shoulder surgery- I am certainly not an expert in this field.  However it does seem that he is making progress as would be expected after such a major reconstruction.  Encouraged him to continue to work on his   Signed Lamar Blinks, MD

## 2015-11-04 NOTE — Progress Notes (Signed)
Pre visit review using our clinic review tool, if applicable. No additional management support is needed unless otherwise documented below in the visit note. 

## 2015-11-04 NOTE — Patient Instructions (Signed)
It was good to see you today- please come by at your convenience for fasting labs.

## 2015-11-15 ENCOUNTER — Encounter: Payer: Self-pay | Admitting: Family Medicine

## 2015-12-07 ENCOUNTER — Encounter: Payer: Self-pay | Admitting: Family Medicine

## 2015-12-17 ENCOUNTER — Encounter: Payer: Self-pay | Admitting: Family Medicine

## 2015-12-17 ENCOUNTER — Other Ambulatory Visit (INDEPENDENT_AMBULATORY_CARE_PROVIDER_SITE_OTHER): Payer: BLUE CROSS/BLUE SHIELD

## 2015-12-17 DIAGNOSIS — Z131 Encounter for screening for diabetes mellitus: Secondary | ICD-10-CM | POA: Diagnosis not present

## 2015-12-17 DIAGNOSIS — Z130 Encounter for screening for diseases of the blood and blood-forming organs and certain disorders involving the immune mechanism: Secondary | ICD-10-CM | POA: Diagnosis not present

## 2015-12-17 DIAGNOSIS — Z125 Encounter for screening for malignant neoplasm of prostate: Secondary | ICD-10-CM | POA: Diagnosis not present

## 2015-12-17 DIAGNOSIS — Z13220 Encounter for screening for lipoid disorders: Secondary | ICD-10-CM | POA: Diagnosis not present

## 2015-12-17 LAB — COMPREHENSIVE METABOLIC PANEL
ALT: 37 U/L (ref 0–53)
AST: 25 U/L (ref 0–37)
Albumin: 5 g/dL (ref 3.5–5.2)
Alkaline Phosphatase: 81 U/L (ref 39–117)
BUN: 14 mg/dL (ref 6–23)
CALCIUM: 10.1 mg/dL (ref 8.4–10.5)
CHLORIDE: 100 meq/L (ref 96–112)
CO2: 31 meq/L (ref 19–32)
Creatinine, Ser: 1.08 mg/dL (ref 0.40–1.50)
GFR: 75.05 mL/min (ref >60.00)
Glucose, Bld: 109 mg/dL — ABNORMAL HIGH (ref 70–99)
Potassium: 3.8 mEq/L (ref 3.5–5.1)
Sodium: 140 mEq/L (ref 135–145)
Total Bilirubin: 0.8 mg/dL (ref 0.2–1.2)
Total Protein: 7.1 g/dL (ref 6.0–8.3)

## 2015-12-17 LAB — LIPID PANEL
CHOL/HDL RATIO: 5
CHOLESTEROL: 186 mg/dL (ref 0–200)
HDL: 39.1 mg/dL (ref >39.00)
LDL CALC: 126 mg/dL — AB (ref 0–99)
NonHDL: 147.33
TRIGLYCERIDES: 108 mg/dL (ref 0.0–149.0)
VLDL: 21.6 mg/dL (ref 0.0–40.0)

## 2015-12-17 LAB — CBC
HEMATOCRIT: 45.5 % (ref 39.0–52.0)
HEMOGLOBIN: 15.6 g/dL (ref 13.0–17.0)
MCHC: 34.2 g/dL (ref 30.0–36.0)
MCV: 91.5 fl (ref 78.0–100.0)
PLATELETS: 280 10*3/uL (ref 150.0–400.0)
RBC: 4.97 Mil/uL (ref 4.22–5.81)
RDW: 12.9 % (ref 11.5–15.5)
WBC: 6.7 10*3/uL (ref 4.0–10.5)

## 2015-12-17 LAB — HEMOGLOBIN A1C: Hgb A1c MFr Bld: 5.5 % (ref 4.6–6.5)

## 2015-12-17 LAB — PSA: PSA: 1.43 ng/mL (ref 0.10–4.00)

## 2015-12-21 DIAGNOSIS — S43432D Superior glenoid labrum lesion of left shoulder, subsequent encounter: Secondary | ICD-10-CM | POA: Diagnosis not present

## 2016-02-25 DIAGNOSIS — L720 Epidermal cyst: Secondary | ICD-10-CM | POA: Diagnosis not present

## 2016-02-25 DIAGNOSIS — L821 Other seborrheic keratosis: Secondary | ICD-10-CM | POA: Diagnosis not present

## 2016-02-25 DIAGNOSIS — L738 Other specified follicular disorders: Secondary | ICD-10-CM | POA: Diagnosis not present

## 2016-03-27 DIAGNOSIS — M9907 Segmental and somatic dysfunction of upper extremity: Secondary | ICD-10-CM | POA: Diagnosis not present

## 2016-03-27 DIAGNOSIS — M9902 Segmental and somatic dysfunction of thoracic region: Secondary | ICD-10-CM | POA: Diagnosis not present

## 2016-03-27 DIAGNOSIS — M9901 Segmental and somatic dysfunction of cervical region: Secondary | ICD-10-CM | POA: Diagnosis not present

## 2016-03-30 DIAGNOSIS — M9907 Segmental and somatic dysfunction of upper extremity: Secondary | ICD-10-CM | POA: Diagnosis not present

## 2016-03-30 DIAGNOSIS — M9902 Segmental and somatic dysfunction of thoracic region: Secondary | ICD-10-CM | POA: Diagnosis not present

## 2016-03-30 DIAGNOSIS — M9901 Segmental and somatic dysfunction of cervical region: Secondary | ICD-10-CM | POA: Diagnosis not present

## 2016-03-30 DIAGNOSIS — M25512 Pain in left shoulder: Secondary | ICD-10-CM | POA: Diagnosis not present

## 2016-04-03 DIAGNOSIS — M25512 Pain in left shoulder: Secondary | ICD-10-CM | POA: Diagnosis not present

## 2016-04-03 DIAGNOSIS — M9901 Segmental and somatic dysfunction of cervical region: Secondary | ICD-10-CM | POA: Diagnosis not present

## 2016-04-03 DIAGNOSIS — M9907 Segmental and somatic dysfunction of upper extremity: Secondary | ICD-10-CM | POA: Diagnosis not present

## 2016-04-03 DIAGNOSIS — M9902 Segmental and somatic dysfunction of thoracic region: Secondary | ICD-10-CM | POA: Diagnosis not present

## 2016-04-05 DIAGNOSIS — M9902 Segmental and somatic dysfunction of thoracic region: Secondary | ICD-10-CM | POA: Diagnosis not present

## 2016-04-05 DIAGNOSIS — M9901 Segmental and somatic dysfunction of cervical region: Secondary | ICD-10-CM | POA: Diagnosis not present

## 2016-04-05 DIAGNOSIS — M9907 Segmental and somatic dysfunction of upper extremity: Secondary | ICD-10-CM | POA: Diagnosis not present

## 2016-04-05 DIAGNOSIS — M25512 Pain in left shoulder: Secondary | ICD-10-CM | POA: Diagnosis not present

## 2016-04-07 DIAGNOSIS — M9902 Segmental and somatic dysfunction of thoracic region: Secondary | ICD-10-CM | POA: Diagnosis not present

## 2016-04-07 DIAGNOSIS — M9907 Segmental and somatic dysfunction of upper extremity: Secondary | ICD-10-CM | POA: Diagnosis not present

## 2016-04-07 DIAGNOSIS — M9901 Segmental and somatic dysfunction of cervical region: Secondary | ICD-10-CM | POA: Diagnosis not present

## 2016-04-07 DIAGNOSIS — M25512 Pain in left shoulder: Secondary | ICD-10-CM | POA: Diagnosis not present

## 2016-04-10 DIAGNOSIS — M25512 Pain in left shoulder: Secondary | ICD-10-CM | POA: Diagnosis not present

## 2016-04-10 DIAGNOSIS — M9907 Segmental and somatic dysfunction of upper extremity: Secondary | ICD-10-CM | POA: Diagnosis not present

## 2016-04-10 DIAGNOSIS — M9902 Segmental and somatic dysfunction of thoracic region: Secondary | ICD-10-CM | POA: Diagnosis not present

## 2016-04-14 DIAGNOSIS — M9901 Segmental and somatic dysfunction of cervical region: Secondary | ICD-10-CM | POA: Diagnosis not present

## 2016-04-14 DIAGNOSIS — M9907 Segmental and somatic dysfunction of upper extremity: Secondary | ICD-10-CM | POA: Diagnosis not present

## 2016-04-24 DIAGNOSIS — M9907 Segmental and somatic dysfunction of upper extremity: Secondary | ICD-10-CM | POA: Diagnosis not present

## 2016-04-24 DIAGNOSIS — M9902 Segmental and somatic dysfunction of thoracic region: Secondary | ICD-10-CM | POA: Diagnosis not present

## 2016-04-24 DIAGNOSIS — M25512 Pain in left shoulder: Secondary | ICD-10-CM | POA: Diagnosis not present

## 2016-04-25 DIAGNOSIS — M5032 Other cervical disc degeneration, mid-cervical region, unspecified level: Secondary | ICD-10-CM | POA: Diagnosis not present

## 2016-04-25 DIAGNOSIS — M25512 Pain in left shoulder: Secondary | ICD-10-CM | POA: Diagnosis not present

## 2016-04-25 DIAGNOSIS — M9907 Segmental and somatic dysfunction of upper extremity: Secondary | ICD-10-CM | POA: Diagnosis not present

## 2016-04-25 DIAGNOSIS — M47812 Spondylosis without myelopathy or radiculopathy, cervical region: Secondary | ICD-10-CM | POA: Diagnosis not present

## 2016-04-25 DIAGNOSIS — M9902 Segmental and somatic dysfunction of thoracic region: Secondary | ICD-10-CM | POA: Diagnosis not present

## 2016-04-28 DIAGNOSIS — M47812 Spondylosis without myelopathy or radiculopathy, cervical region: Secondary | ICD-10-CM | POA: Diagnosis not present

## 2016-04-28 DIAGNOSIS — M5032 Other cervical disc degeneration, mid-cervical region, unspecified level: Secondary | ICD-10-CM | POA: Diagnosis not present

## 2016-04-28 DIAGNOSIS — M9907 Segmental and somatic dysfunction of upper extremity: Secondary | ICD-10-CM | POA: Diagnosis not present

## 2016-04-28 DIAGNOSIS — M9901 Segmental and somatic dysfunction of cervical region: Secondary | ICD-10-CM | POA: Diagnosis not present

## 2016-05-10 DIAGNOSIS — M9907 Segmental and somatic dysfunction of upper extremity: Secondary | ICD-10-CM | POA: Diagnosis not present

## 2016-05-10 DIAGNOSIS — M5032 Other cervical disc degeneration, mid-cervical region, unspecified level: Secondary | ICD-10-CM | POA: Diagnosis not present

## 2016-05-10 DIAGNOSIS — M9901 Segmental and somatic dysfunction of cervical region: Secondary | ICD-10-CM | POA: Diagnosis not present

## 2016-05-10 DIAGNOSIS — M47812 Spondylosis without myelopathy or radiculopathy, cervical region: Secondary | ICD-10-CM | POA: Diagnosis not present

## 2016-05-22 DIAGNOSIS — M47812 Spondylosis without myelopathy or radiculopathy, cervical region: Secondary | ICD-10-CM | POA: Diagnosis not present

## 2016-05-22 DIAGNOSIS — M9907 Segmental and somatic dysfunction of upper extremity: Secondary | ICD-10-CM | POA: Diagnosis not present

## 2016-05-22 DIAGNOSIS — M5032 Other cervical disc degeneration, mid-cervical region, unspecified level: Secondary | ICD-10-CM | POA: Diagnosis not present

## 2016-05-29 DIAGNOSIS — J329 Chronic sinusitis, unspecified: Secondary | ICD-10-CM | POA: Diagnosis not present

## 2016-05-31 ENCOUNTER — Ambulatory Visit: Payer: BLUE CROSS/BLUE SHIELD | Admitting: Family Medicine

## 2016-08-26 DIAGNOSIS — Z23 Encounter for immunization: Secondary | ICD-10-CM | POA: Diagnosis not present

## 2016-10-09 DIAGNOSIS — M5136 Other intervertebral disc degeneration, lumbar region: Secondary | ICD-10-CM | POA: Diagnosis not present

## 2016-10-09 DIAGNOSIS — M9902 Segmental and somatic dysfunction of thoracic region: Secondary | ICD-10-CM | POA: Diagnosis not present

## 2016-10-09 DIAGNOSIS — M9901 Segmental and somatic dysfunction of cervical region: Secondary | ICD-10-CM | POA: Diagnosis not present

## 2016-10-09 DIAGNOSIS — M9903 Segmental and somatic dysfunction of lumbar region: Secondary | ICD-10-CM | POA: Diagnosis not present

## 2016-10-25 ENCOUNTER — Other Ambulatory Visit: Payer: Self-pay | Admitting: Emergency Medicine

## 2016-10-25 ENCOUNTER — Telehealth: Payer: Self-pay | Admitting: Family Medicine

## 2016-10-25 MED ORDER — BUDESONIDE 32 MCG/ACT NA SUSP: 1.0000 | Freq: Every day | NASAL | 0 refills | Status: DC | PRN

## 2016-10-25 NOTE — Telephone Encounter (Signed)
Received refill request on Rhinocort Nasal Spray. This medication is not currently on pt's med list but pt states that he has used it in the past and that it works better for him than Flonase. Discussed with provider, provider approved sending over rx for Rhinocort. Informed pt that rx would be sent over. Pt verbalized understanding.

## 2016-10-25 NOTE — Telephone Encounter (Signed)
Caller name: [name] Relationship to patient: Wife Can be reached: [phone] Pharmacy:  [facility] [place], [place] [phone] ([phone] (Fax)     Reason for call: Request refill on Rhinacort Nasal Spray. States they are flying next week and he will need it.

## 2016-11-19 ENCOUNTER — Other Ambulatory Visit: Payer: Self-pay | Admitting: Family Medicine

## 2016-11-19 DIAGNOSIS — F32A Depression, unspecified: Secondary | ICD-10-CM

## 2016-11-19 DIAGNOSIS — I10 Essential (primary) hypertension: Secondary | ICD-10-CM

## 2016-11-19 DIAGNOSIS — F329 Major depressive disorder, single episode, unspecified: Secondary | ICD-10-CM

## 2016-11-22 DIAGNOSIS — J330 Polyp of nasal cavity: Secondary | ICD-10-CM | POA: Diagnosis not present

## 2017-01-01 DIAGNOSIS — M9901 Segmental and somatic dysfunction of cervical region: Secondary | ICD-10-CM | POA: Diagnosis not present

## 2017-01-01 DIAGNOSIS — M5136 Other intervertebral disc degeneration, lumbar region: Secondary | ICD-10-CM | POA: Diagnosis not present

## 2017-01-01 DIAGNOSIS — M9903 Segmental and somatic dysfunction of lumbar region: Secondary | ICD-10-CM | POA: Diagnosis not present

## 2017-01-01 DIAGNOSIS — M9902 Segmental and somatic dysfunction of thoracic region: Secondary | ICD-10-CM | POA: Diagnosis not present

## 2017-02-04 NOTE — Progress Notes (Addendum)
[place] at [facility] [address] [place], [place] [address] [phone]  Date:  02/05/2017   Name:  Henry Wallace   DOB:  1961/07/15   MRN:  [id number]  PCP:  Darreld Mclean, MD    Chief Complaint: Annual Exam (Pt here for CPE. )   History of Present Illness:  Henry Wallace is a 56 y.o. very pleasant male patient who presents with the following:  Last seen by myself in March of 2017 for a CPE- HPI as follows  Here today for a CPE History of obesity, HTN, depression Colonoscopy is UTD, immunizations are UTD Annual PSA has looked fine.  Most recent labs in 06/2014  He did have surgery over thanksgiving- major open left shoulder repair.  He continues to have some pain - no longer doing formal PT, he is doing this at home.  He is frustrated by his continued pain and wants to know if he will continue to get better/ is his progress normal. He does feel that his shoulder is steadily improving  His BP is controlled, no SE such as HA His depression is well controlled with celexa.    He wonders about zostavax but we are not sure if this will be covered by insurance  He is married, one son Henry Wallace who is going through some difficulty right now- this is a stressor       He has been well since our last OV Last labs in March He does do PSA testing- due for this today Needs his hep C screening  He had a banana today so far   His wife notes that he was drinking alcohol daily- he has cut down to a few times a week now and feels better  There is a family history of CAD/ MI.  He does dance some once a week, he tries to be active every day  He did have hep C screening a few years ago when he was ill- he was negative  No CP or abnormal SOB when he is exercising  Colonoscopy and tetanus are UTD Wt Readings from Last 3 Encounters:  02/05/17 224 lb 9.6 oz (101.9 kg)  11/04/15 226 lb (102.5 kg)  06/29/14 228 lb (103.4 kg)    BP Readings from Last 3 Encounters:  02/05/17 122/84  11/04/15 130/90  06/29/14 136/90     Patient Active Problem List   Diagnosis Date Noted  . MDD (major depressive disorder), recurrent, in full remission ([place]) 11/04/2015  . OBESITY, UNSPECIFIED 07/06/2009  . HYPERTENSION, BENIGN 07/06/2009  . CHEST PAIN, PRECORDIAL 07/06/2009    Past Medical History:  Diagnosis Date  . Hypertension     Past Surgical History:  Procedure Laterality Date  . HERNIA REPAIR    . PROSTATE SURGERY    . VASECTOMY      Social History  Substance Use Topics  . Smoking status: Never Smoker  . Smokeless tobacco: Not on file  . Alcohol use 0.0 oz/week     Comment: 8    Family History  Problem Relation Age of Onset  . Diabetes Maternal Grandmother   . Cancer Maternal Grandfather   . Heart disease Paternal Grandfather     No Known Allergies  Medication list has been reviewed and updated.  Current Outpatient Prescriptions on File Prior to Visit  Medication Sig Dispense Refill  . amLODipine (NORVASC) 10 MG tablet TAKE 1 TABLET BY MOUTH DAILY 90  tablet 0  . budesonide (RHINOCORT AQUA) 32 MCG/ACT nasal spray Place 1 spray into both nostrils daily as needed for rhinitis. 8.6 g 0  . citalopram (CELEXA) 20 MG tablet TAKE 1 TABLET BY MOUTH DAILY 90 tablet 0  . hydrochlorothiazide (HYDRODIURIL) 25 MG tablet TAKE 1 TABLET BY MOUTH EVERY DAY 90 tablet 0  . OVER THE COUNTER MEDICATION Vitamin D 3 5000 mg taking daily     No current facility-administered medications on file prior to visit.     Review of Systems:  As per HPI- otherwise negative.   Physical Examination: Vitals:   02/05/17 1316  BP: 122/84  Pulse: 66  Temp: 98.1 F (36.7 C)   Vitals:   02/05/17 1316  Weight: 224 lb 9.6 oz (101.9 kg)  Height: 5\' 9"  (1.753 m)   Body mass index is 33.17 kg/m. Ideal Body Weight: Weight in (lb) to have BMI = 25: 168.9  GEN: WDWN, NAD, Non-toxic, A & O x 3, overweight/ large build.   Looks well HEENT: Atraumatic, Normocephalic. Neck supple. No masses, No LAD.  Bilateral TM wnl, oropharynx normal.  PEERL,EOMI.   Ears and Nose: No external deformity. CV: RRR, No M/G/R. No JVD. No thrill. No extra heart sounds. PULM: CTA B, no wheezes, crackles, rhonchi. No retractions. No resp. distress. No accessory muscle use. ABD: S, NT, ND EXTR: No c/c/e NEURO Normal gait.  PSYCH: Normally interactive. Conversant. Not depressed or anxious appearing.  Calm demeanor.    Assessment and Plan: Physical exam  Screening for deficiency anemia - Plan: CBC  Screening for hyperlipidemia - Plan: Lipid panel  Screening for diabetes mellitus - Plan: Comprehensive metabolic panel, Hemoglobin A1c  Screening for prostate cancer - Plan: PSA  Essential hypertension - Plan: amLODipine (NORVASC) 10 MG tablet, hydrochlorothiazide (HYDRODIURIL) 25 MG tablet  Major depressive disorder with single episode, in remission ([place]) - Plan: citalopram (CELEXA) 20 MG tablet  CPE today Labs pending as above BP is well controlled- refilled medications His mood is also ok- continue celexa Will plan further follow- up pending labs.    Signed Lamar Blinks, MD Received his labs as below:  Results for orders placed or performed in visit on 02/05/17  CBC  Result Value Ref Range   WBC 8.4 4.0 - 10.5 K/uL   RBC 4.92 4.22 - 5.81 Mil/uL   Platelets 251.0 150.0 - 400.0 K/uL   Hemoglobin 15.8 13.0 - 17.0 g/dL   HCT 45.0 39.0 - 52.0 %   MCV 91.4 78.0 - 100.0 fl   MCHC 35.2 30.0 - 36.0 g/dL   RDW 12.9 11.5 - 15.5 %  Comprehensive metabolic panel  Result Value Ref Range   Sodium 138 135 - 145 mEq/L   Potassium 4.0 3.5 - 5.1 mEq/L   Chloride 101 96 - 112 mEq/L   CO2 29 19 - 32 mEq/L   Glucose, Bld 92 70 - 99 mg/dL   BUN 16 6 - 23 mg/dL   Creatinine, Ser 1.20 0.40 - 1.50 mg/dL   Total Bilirubin 0.9 0.2 - 1.2 mg/dL   Alkaline Phosphatase 76 39 - 117 U/L   AST 31 0 - 37 U/L   ALT 35 0 - 53 U/L    Total Protein 7.1 6.0 - 8.3 g/dL   Albumin 5.1 3.5 - 5.2 g/dL   Calcium 10.2 8.4 - 10.5 mg/dL   GFR 66.19 >60.00 mL/min  Lipid panel  Result Value Ref Range   Cholesterol 194 0 - 200 mg/dL  Triglycerides 144.0 0.0 - 149.0 mg/dL   HDL 42.40 >39.00 mg/dL   VLDL 28.8 0.0 - 40.0 mg/dL   LDL Cholesterol 123 (H) 0 - 99 mg/dL   Total CHOL/HDL Ratio 5    NonHDL 151.83   Hemoglobin A1c  Result Value Ref Range   Hgb A1c MFr Bld 5.3 4.6 - 6.5 %  PSA  Result Value Ref Range   PSA 1.74 0.10 - 4.00 ng/mL    Calculated his 10 year CV risk at 12%.  Will send letter to pt

## 2017-02-05 ENCOUNTER — Ambulatory Visit (INDEPENDENT_AMBULATORY_CARE_PROVIDER_SITE_OTHER): Payer: BLUE CROSS/BLUE SHIELD | Admitting: Family Medicine

## 2017-02-05 VITALS — BP 122/84 | HR 66 | Temp 98.1°F | Ht 69.0 in | Wt 224.6 lb

## 2017-02-05 DIAGNOSIS — Z131 Encounter for screening for diabetes mellitus: Secondary | ICD-10-CM

## 2017-02-05 DIAGNOSIS — Z130 Encounter for screening for diseases of the blood and blood-forming organs and certain disorders involving the immune mechanism: Secondary | ICD-10-CM | POA: Diagnosis not present

## 2017-02-05 DIAGNOSIS — Z13220 Encounter for screening for lipoid disorders: Secondary | ICD-10-CM

## 2017-02-05 DIAGNOSIS — Z0000 Encounter for general adult medical examination without abnormal findings: Secondary | ICD-10-CM | POA: Diagnosis not present

## 2017-02-05 DIAGNOSIS — F325 Major depressive disorder, single episode, in full remission: Secondary | ICD-10-CM | POA: Diagnosis not present

## 2017-02-05 DIAGNOSIS — Z125 Encounter for screening for malignant neoplasm of prostate: Secondary | ICD-10-CM

## 2017-02-05 DIAGNOSIS — I10 Essential (primary) hypertension: Secondary | ICD-10-CM

## 2017-02-05 LAB — CBC
HCT: 45 % (ref 39.0–52.0)
Hemoglobin: 15.8 g/dL (ref 13.0–17.0)
MCHC: 35.2 g/dL (ref 30.0–36.0)
MCV: 91.4 fl (ref 78.0–100.0)
Platelets: 251 10*3/uL (ref 150.0–400.0)
RBC: 4.92 Mil/uL (ref 4.22–5.81)
RDW: 12.9 % (ref 11.5–15.5)
WBC: 8.4 10*3/uL (ref 4.0–10.5)

## 2017-02-05 LAB — LIPID PANEL
CHOL/HDL RATIO: 5
Cholesterol: 194 mg/dL (ref 0–200)
HDL: 42.4 mg/dL (ref >39.00)
LDL CALC: 123 mg/dL — AB (ref 0–99)
NonHDL: 151.83
TRIGLYCERIDES: 144 mg/dL (ref 0.0–149.0)
VLDL: 28.8 mg/dL (ref 0.0–40.0)

## 2017-02-05 LAB — COMPREHENSIVE METABOLIC PANEL
ALT: 35 U/L (ref 0–53)
AST: 31 U/L (ref 0–37)
Albumin: 5.1 g/dL (ref 3.5–5.2)
Alkaline Phosphatase: 76 U/L (ref 39–117)
BUN: 16 mg/dL (ref 6–23)
CO2: 29 mEq/L (ref 19–32)
Calcium: 10.2 mg/dL (ref 8.4–10.5)
Chloride: 101 mEq/L (ref 96–112)
Creatinine, Ser: 1.2 mg/dL (ref 0.40–1.50)
GFR: 66.19 mL/min (ref >60.00)
Glucose, Bld: 92 mg/dL (ref 70–99)
Potassium: 4 mEq/L (ref 3.5–5.1)
Sodium: 138 mEq/L (ref 135–145)
Total Bilirubin: 0.9 mg/dL (ref 0.2–1.2)
Total Protein: 7.1 g/dL (ref 6.0–8.3)

## 2017-02-05 LAB — HEMOGLOBIN A1C: Hgb A1c MFr Bld: 5.3 % (ref 4.6–6.5)

## 2017-02-05 LAB — PSA: PSA: 1.74 ng/mL (ref 0.10–4.00)

## 2017-02-05 MED ORDER — AMLODIPINE BESYLATE 10 MG PO TABS: 10.0000 mg | ORAL_TABLET | Freq: Every day | ORAL | 3 refills | Status: DC

## 2017-02-05 MED ORDER — CITALOPRAM HYDROBROMIDE 20 MG PO TABS: 20.0000 mg | ORAL_TABLET | Freq: Every day | ORAL | 3 refills | Status: DC

## 2017-02-05 MED ORDER — HYDROCHLOROTHIAZIDE 25 MG PO TABS: 25.0000 mg | ORAL_TABLET | Freq: Every day | ORAL | 3 refills | Status: DC

## 2017-02-05 NOTE — Patient Instructions (Signed)
It was great to see you today- take care and I will be in touch with your labs asap

## 2017-02-07 ENCOUNTER — Telehealth: Payer: Self-pay | Admitting: Family Medicine

## 2017-02-07 NOTE — Telephone Encounter (Signed)
Call from [place] with [facility] at [facility] [phone]  No tdap in records. Pt will likely need to have one.

## 2017-02-09 ENCOUNTER — Other Ambulatory Visit: Payer: Self-pay | Admitting: Emergency Medicine

## 2017-02-09 DIAGNOSIS — Z23 Encounter for immunization: Secondary | ICD-10-CM

## 2017-06-19 DIAGNOSIS — J330 Polyp of nasal cavity: Secondary | ICD-10-CM | POA: Diagnosis not present

## 2017-06-19 DIAGNOSIS — G4733 Obstructive sleep apnea (adult) (pediatric): Secondary | ICD-10-CM | POA: Diagnosis not present

## 2017-12-15 DIAGNOSIS — Z23 Encounter for immunization: Secondary | ICD-10-CM | POA: Diagnosis not present

## 2018-02-18 ENCOUNTER — Encounter: Payer: BLUE CROSS/BLUE SHIELD | Admitting: Family Medicine

## 2018-03-13 ENCOUNTER — Other Ambulatory Visit: Payer: Self-pay | Admitting: Family Medicine

## 2018-03-13 DIAGNOSIS — I10 Essential (primary) hypertension: Secondary | ICD-10-CM

## 2018-03-13 DIAGNOSIS — F325 Major depressive disorder, single episode, in full remission: Secondary | ICD-10-CM

## 2018-03-21 DIAGNOSIS — M25562 Pain in left knee: Secondary | ICD-10-CM | POA: Diagnosis not present
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
## 2018-04-10 ENCOUNTER — Encounter: Payer: Self-pay | Admitting: Family Medicine

## 2018-04-10 ENCOUNTER — Ambulatory Visit (INDEPENDENT_AMBULATORY_CARE_PROVIDER_SITE_OTHER): Payer: BLUE CROSS/BLUE SHIELD | Admitting: Family Medicine

## 2018-04-10 VITALS — BP 130/84 | HR 64 | Resp 16 | Ht 69.0 in | Wt 236.0 lb

## 2018-04-10 DIAGNOSIS — Z0000 Encounter for general adult medical examination without abnormal findings: Secondary | ICD-10-CM | POA: Diagnosis not present

## 2018-04-10 DIAGNOSIS — I10 Essential (primary) hypertension: Secondary | ICD-10-CM | POA: Diagnosis not present

## 2018-04-10 DIAGNOSIS — Z13220 Encounter for screening for lipoid disorders: Secondary | ICD-10-CM

## 2018-04-10 DIAGNOSIS — Z125 Encounter for screening for malignant neoplasm of prostate: Secondary | ICD-10-CM

## 2018-04-10 DIAGNOSIS — Z131 Encounter for screening for diabetes mellitus: Secondary | ICD-10-CM | POA: Diagnosis not present

## 2018-04-10 DIAGNOSIS — Z130 Encounter for screening for diseases of the blood and blood-forming organs and certain disorders involving the immune mechanism: Secondary | ICD-10-CM

## 2018-04-10 DIAGNOSIS — R6882 Decreased libido: Secondary | ICD-10-CM

## 2018-04-10 MED ORDER — BUPROPION HCL ER (SR) 150 MG PO TB12: 150.0000 mg | ORAL_TABLET | Freq: Two times a day (BID) | ORAL | 6 refills | Status: DC

## 2018-04-10 MED ORDER — AMLODIPINE BESYLATE 10 MG PO TABS: ORAL_TABLET | ORAL | 3 refills | Status: DC

## 2018-04-10 MED ORDER — HYDROCHLOROTHIAZIDE 25 MG PO TABS: ORAL_TABLET | ORAL | 3 refills | Status: DC

## 2018-04-10 NOTE — Patient Instructions (Addendum)
It was good to see you today- we will get your routine labs today Please come in for a lab visit at your convenience in the early morning so we can check your testosterone  I do agree that drinking less alcohol will be good for your mood; in fact if you are suffering from depression I would encorage you to drink little if any. Also getting more exercise- as your knee will allow- will also be good for your mood!   Lets' switch from celexa to wellbutrin- cut your celexa to 10 mg for a week, then stop using it and change to Wellbutrin We will use Wellbutrin SR 150- twice a day.  However take it once a day for the first 3 days of use Let me know how this works for you!    Health Maintenance, Male A healthy lifestyle and preventive care is important for your health and wellness. Ask your health care provider about what schedule of regular examinations is right for you. What should I know about weight and diet? Eat a Healthy Diet  Eat plenty of vegetables, fruits, whole grains, low-fat dairy products, and lean protein.  Do not eat a lot of foods high in solid fats, added sugars, or salt.  Maintain a Healthy Weight Regular exercise can help you achieve or maintain a healthy weight. You should:  Do at least 150 minutes of exercise each week. The exercise should increase your heart rate and make you sweat (moderate-intensity exercise).  Do strength-training exercises at least twice a week.  Watch Your Levels of Cholesterol and Blood Lipids  Have your blood tested for lipids and cholesterol every 5 years starting at 59 years of age. If you are at high risk for heart disease, you should start having your blood tested when you are 59 years old. You may need to have your cholesterol levels checked more often if: ? Your lipid or cholesterol levels are high. ? You are older than 58 years of age. ? You are at high risk for heart disease.  What should I know about cancer screening? Many types of  cancers can be detected early and may often be prevented. Lung Cancer  You should be screened every year for lung cancer if: ? You are a current smoker who has smoked for at least 30 years. ? You are a former smoker who has quit within the past 15 years.  Talk to your health care provider about your screening options, when you should start screening, and how often you should be screened.  Colorectal Cancer  Routine colorectal cancer screening usually begins at 59 years of age and should be repeated every 5-10 years until you are 58 years old. You may need to be screened more often if early forms of precancerous polyps or small growths are found. Your health care provider may recommend screening at an earlier age if you have risk factors for colon cancer.  Your health care provider may recommend using home test kits to check for hidden blood in the stool.  A small camera at the end of a tube can be used to examine your colon (sigmoidoscopy or colonoscopy). This checks for the earliest forms of colorectal cancer.  Prostate and Testicular Cancer  Depending on your age and overall health, your health care provider may do certain tests to screen for prostate and testicular cancer.  Talk to your health care provider about any symptoms or concerns you have about testicular or prostate cancer.  Skin Cancer  Check your skin from head to toe regularly.  Tell your health care provider about any new moles or changes in moles, especially if: ? There is a change in a mole's size, shape, or color. ? You have a mole that is larger than a pencil eraser.  Always use sunscreen. Apply sunscreen liberally and repeat throughout the day.  Protect yourself by wearing long sleeves, pants, a wide-brimmed hat, and sunglasses when outside.  What should I know about heart disease, diabetes, and high blood pressure?  If you are 80-80 years of age, have your blood pressure checked every 3-5 years. If you are  7 years of age or older, have your blood pressure checked every year. You should have your blood pressure measured twice-once when you are at a hospital or clinic, and once when you are not at a hospital or clinic. Record the average of the two measurements. To check your blood pressure when you are not at a hospital or clinic, you can use: ? An automated blood pressure machine at a pharmacy. ? A home blood pressure monitor.  Talk to your health care provider about your target blood pressure.  If you are between 14-39 years old, ask your health care provider if you should take aspirin to prevent heart disease.  Have regular diabetes screenings by checking your fasting blood sugar level. ? If you are at a normal weight and have a low risk for diabetes, have this test once every three years after the age of 69. ? If you are overweight and have a high risk for diabetes, consider being tested at a younger age or more often.  A one-time screening for abdominal aortic aneurysm (AAA) by ultrasound is recommended for men aged 53-75 years who are current or former smokers. What should I know about preventing infection? Hepatitis B If you have a higher risk for hepatitis B, you should be screened for this virus. Talk with your health care provider to find out if you are at risk for hepatitis B infection. Hepatitis C Blood testing is recommended for:  Everyone born from [birth year] through 1967.  Anyone with known risk factors for hepatitis C.  Sexually Transmitted Diseases (STDs)  You should be screened each year for STDs including gonorrhea and chlamydia if: ? You are sexually active and are younger than 59 years of age. ? You are older than 59 years of age and your health care provider tells you that you are at risk for this type of infection. ? Your sexual activity has changed since you were last screened and you are at an increased risk for chlamydia or gonorrhea. Ask your health care provider if you  are at risk.  Talk with your health care provider about whether you are at high risk of being infected with HIV. Your health care provider may recommend a prescription medicine to help prevent HIV infection.  What else can I do?  Schedule regular health, dental, and eye exams.  Stay current with your vaccines (immunizations).  Do not use any tobacco products, such as cigarettes, chewing tobacco, and e-cigarettes. If you need help quitting, ask your health care provider.  Limit alcohol intake to no more than 2 drinks per day. One drink equals 12 ounces of beer, 5 ounces of wine, or 1 ounces of hard liquor.  Do not use street drugs.  Do not share needles.  Ask your health care provider for help if you need support or information about quitting drugs.  Tell  your health care provider if you often feel depressed.  Tell your health care provider if you have ever been abused or do not feel safe at home. This information is not intended to replace advice given to you by your health care provider. Make sure you discuss any questions you have with your health care provider. Document Released: 02/10/2008 Document Revised: 04/12/2016 Document Reviewed: 05/18/2015 Elsevier Interactive Patient Education  [employer].

## 2018-04-11 ENCOUNTER — Other Ambulatory Visit (INDEPENDENT_AMBULATORY_CARE_PROVIDER_SITE_OTHER): Payer: BLUE CROSS/BLUE SHIELD

## 2018-04-11 DIAGNOSIS — R6882 Decreased libido: Secondary | ICD-10-CM

## 2018-04-11 LAB — CBC
HEMATOCRIT: 43 % (ref 39.0–52.0)
Hemoglobin: 15.1 g/dL (ref 13.0–17.0)
MCHC: 35.1 g/dL (ref 30.0–36.0)
MCV: 91.6 fl (ref 78.0–100.0)
PLATELETS: 233 10*3/uL (ref 150.0–400.0)
RBC: 4.69 Mil/uL (ref 4.22–5.81)
RDW: 12.8 % (ref 11.5–15.5)
WBC: 6.3 10*3/uL (ref 4.0–10.5)

## 2018-04-11 LAB — COMPREHENSIVE METABOLIC PANEL
ALBUMIN: 4.6 g/dL (ref 3.5–5.2)
ALT: 33 U/L (ref 0–53)
AST: 23 U/L (ref 0–37)
Alkaline Phosphatase: 67 U/L (ref 39–117)
BUN: 16 mg/dL (ref 6–23)
CALCIUM: 9.8 mg/dL (ref 8.4–10.5)
CHLORIDE: 101 meq/L (ref 96–112)
CO2: 32 mEq/L (ref 19–32)
Creatinine, Ser: 1.13 mg/dL (ref 0.40–1.50)
GFR: 70.65 mL/min (ref >60.00)
Glucose, Bld: 124 mg/dL — ABNORMAL HIGH (ref 70–99)
POTASSIUM: 3.7 meq/L (ref 3.5–5.1)
Sodium: 139 mEq/L (ref 135–145)
Total Bilirubin: 0.9 mg/dL (ref 0.2–1.2)
Total Protein: 6.7 g/dL (ref 6.0–8.3)

## 2018-04-11 LAB — LIPID PANEL
CHOLESTEROL: 151 mg/dL (ref 0–200)
HDL: 33.1 mg/dL — AB (ref >39.00)
LDL Cholesterol: 94 mg/dL (ref 0–99)
NonHDL: 117.83
Total CHOL/HDL Ratio: 5
Triglycerides: 117 mg/dL (ref 0.0–149.0)
VLDL: 23.4 mg/dL (ref 0.0–40.0)

## 2018-04-11 LAB — PSA: PSA: 1.55 ng/mL (ref 0.10–4.00)

## 2018-04-11 LAB — HEMOGLOBIN A1C: Hgb A1c MFr Bld: 5.5 % (ref 4.6–6.5)

## 2018-04-12 ENCOUNTER — Encounter: Payer: Self-pay | Admitting: Family Medicine

## 2018-04-12 LAB — TESTOSTERONE TOTAL,FREE,BIO, MALES
Albumin: 4.4 g/dL (ref 3.6–5.1)
SEX HORMONE BINDING: 27 nmol/L (ref 22–77)
TESTOSTERONE BIOAVAILABLE: 138.3 ng/dL (ref >=110.0)
TESTOSTERONE FREE: 68.7 pg/mL (ref 46.0–224.0)
TESTOSTERONE: 438 ng/dL (ref 250–827)

## 2018-04-12 NOTE — Progress Notes (Signed)
Results for orders placed or performed in visit on 04/11/18  Testosterone Total,Free,Bio, Males-([facility])  Result Value Ref Range   Testosterone 438 250 - 827 ng/dL   Albumin 4.4 3.6 - 5.1 g/dL   Sex Hormone Binding 27 22 - 77 nmol/L   Testosterone, Free 68.7 46.0 - 224.0 pg/mL   Testosterone, Bioavailable 138.3 110.0 - 575 ng/dL     Chemistry      Component Value Date/Time   NA 139 04/11/2018 0750   K 3.7 04/11/2018 0750   CL 101 04/11/2018 0750   CO2 32 04/11/2018 0750   BUN 16 04/11/2018 0750   CREATININE 1.13 04/11/2018 0750   CREATININE 1.12 06/29/2014 1038      Component Value Date/Time   CALCIUM 9.8 04/11/2018 0750   ALKPHOS 67 04/11/2018 0750   AST 23 04/11/2018 0750   ALT 33 04/11/2018 0750   BILITOT 0.9 04/11/2018 0750     Lipids:    Component Value Date/Time   CHOL 151 04/11/2018 0750   TRIG 117.0 04/11/2018 0750   HDL 33.10 (L) 04/11/2018 0750   VLDL 23.4 04/11/2018 0750   CHOLHDL 5 04/11/2018 0750   Lab Results  Component Value Date   WBC 6.3 04/11/2018   HGB 15.1 04/11/2018   HCT 43.0 04/11/2018   MCV 91.6 04/11/2018   PLT 233.0 04/11/2018   Lab Results  Component Value Date   PSA 1.55 04/11/2018   PSA 1.74 02/05/2017   PSA 1.43 12/17/2015   Lab Results  Component Value Date   HGBA1C 5.5 04/11/2018   BP Readings from Last 3 Encounters:  04/10/18 130/84  02/05/17 122/84  11/04/15 130/90

## 2018-04-15 ENCOUNTER — Other Ambulatory Visit: Payer: Self-pay | Admitting: Family Medicine

## 2018-04-15 DIAGNOSIS — E785 Hyperlipidemia, unspecified: Secondary | ICD-10-CM

## 2018-04-15 MED ORDER — LOVASTATIN 20 MG PO TABS: 20.0000 mg | ORAL_TABLET | Freq: Every day | ORAL | 3 refills | Status: DC

## 2018-05-16 ENCOUNTER — Telehealth: Payer: Self-pay

## 2018-05-16 DIAGNOSIS — F325 Major depressive disorder, single episode, in full remission: Secondary | ICD-10-CM

## 2018-05-16 MED ORDER — CITALOPRAM HYDROBROMIDE 20 MG PO TABS: 20.0000 mg | ORAL_TABLET | Freq: Every day | ORAL | 3 refills | Status: DC

## 2018-05-16 NOTE — Addendum Note (Signed)
Addended by: Lamar Blinks C on: 05/16/2018 02:39 PM   Modules accepted: Orders

## 2018-05-16 NOTE — Telephone Encounter (Signed)
Copied from [place] [id number]. Topic: General - Other >> May 16, 2018  8:46 AM Carolyn Stare wrote:  Wife call to say husband does not want to change medicine would like to stay on CITALOPRAM and need a RX sent to the [facility]

## 2018-05-16 NOTE — Telephone Encounter (Signed)
Called and spoke with pt himself to confirm- he agrees he would like to continue celexa and did not end up changing over to wellbutrin

## 2018-06-27 ENCOUNTER — Telehealth: Payer: Self-pay | Admitting: Family Medicine

## 2018-06-27 NOTE — Telephone Encounter (Signed)
Pt walked in and wanted to be seen by Dr. Lorelei Pont. He said he had a wall fall on his left shoulder and ear. He states that he has full range of motion but wife suggested that he be seen before going home.  I asked nurse to advise she wanted him to go to ER since Dr. Lorelei Pont didn't have any available openings on her schedule today. I gave information regarding [facility] and also suggested visiting an urgent care. Pt states he will go by Urgent care before going home.

## 2018-07-03 ENCOUNTER — Ambulatory Visit: Payer: Self-pay | Admitting: *Deleted

## 2018-07-03 NOTE — Telephone Encounter (Signed)
Pt called stating that he was working at home and part of wall came down on left shoulder on 06/26/18; he says that it hit hard across collar bone; there is swelling and discolored; the pt stated that he came to the office when the same day but there were no providers available to evaluate him; he then went to another urgent care but it was permanently closed;   recommendations made per nurse triage; the pt states that he is out of town and he does not want to go to ED/urgent care; he would like to see Dr Lorelei Pont when he gets back in town; pt offered and accepted appointment with Dr Lamar Blinks, [place], 07/08/90 at 1015; he verbalized understanding; will route to office for notification of this upcoming appointment.  Reason for Disposition . Large swelling or bruise (> 2 inches or 5 cm)  Answer Assessment - Initial Assessment Questions 1. MECHANISM: "How did the injury happen?"   Wall fell on pt 2. ONSET: "When did the injury happen?" (Minutes or hours ago)      06/26/18 3. APPEARANCE of INJURY: "What does the injury look like?"      Bruising and swelling 4. SEVERITY: "Can you move the shoulder normally?"      Yes; full range of motion 5. SIZE: For cuts, bruises, or swelling, ask: "How large is it?" (e.g., inches or centimeters;  entire joint)     Size of a salad plate 6. PAIN: "Is there pain?" If so, ask: "How bad is the pain?"   (e.g., Scale 1-10; or mild, moderate, severe)     Pain going down arm is 3 out of 10; painful to touch rated 7 out of 10 7. TETANUS: For any breaks in the skin, ask: "When was the last tetanus booster?"    Scrape on left ear; not sure of last tetanus 8. OTHER SYMPTOMS: "Do you have any other symptoms?" (e.g., loss of sensation)     no 9. PREGNANCY: "Is there any chance you are pregnant?" "When was your last menstrual period?"     n/a  Protocols used: SHOULDER INJURY-A-AH

## 2018-07-07 NOTE — Progress Notes (Signed)
[place] at [facility] [place], [place], [place] [address] [phone]  Date:  07/08/2018   Name:  Henry Wallace   DOB:  [birth year]   MRN:  [id number]  PCP:  Darreld Mclean, MD    Chief Complaint: Shoulder Pain (left shoulder injury, 10/31 while working on house "dropped a wall on it" swelling, knot)   History of Present Illness:  Henry Wallace is a 57 y.o. very pleasant male patient who presents with the following:  Here today with a left shoulder injury History of depression, HTN, hyperlipidemia He had walked in on 10/31 with complaint of falling and hurting his shoulder but we were not able to see him, he was referred to the ER but did not end up going. He does seem to be getting better but thought he should be seen  About 2 weeks ago now he was working in his storage area, and was taking down a wall- a part of the wall, weighing approx 150 lbs, fell and hit him on he left side It hit his ear, and his left shoulder No LOC, he scraped his left ear and bruised and contused his left shoulder and clavicle  His ear is still sore- it is a bit red and tender He is not having headaches, no concussion sx His shoulder is still sore - the bruising is better than it was and pain is also imporved  He had left shoulder surgery- reconstruction done about 2 years ago He can use the shoulder normally except it is tender to touch over the distal clavicle  tdap in 2014 Do flu shot today  Patient Active Problem List   Diagnosis Date Noted  . MDD (major depressive disorder), recurrent, in full remission ([place]) 11/04/2015  . OBESITY, UNSPECIFIED 07/06/2009  . HYPERTENSION, BENIGN 07/06/2009  . CHEST PAIN, PRECORDIAL 07/06/2009    Past Medical History:  Diagnosis Date  . Hypertension     Past Surgical History:  Procedure Laterality Date  . HERNIA REPAIR    . PROSTATE SURGERY    . VASECTOMY      Social History   Tobacco  Use  . Smoking status: Never Smoker  . Smokeless tobacco: Never Used  Substance Use Topics  . Alcohol use: Yes    Alcohol/week: 0.0 standard drinks    Comment: 8  . Drug use: No    Family History  Problem Relation Age of Onset  . Diabetes Maternal Grandmother   . Cancer Maternal Grandfather   . Heart disease Paternal Grandfather     No Known Allergies  Medication list has been reviewed and updated.  Current Outpatient Medications on File Prior to Visit  Medication Sig Dispense Refill  . amLODipine (NORVASC) 10 MG tablet TAKE 1 TABLET(10 MG) BY MOUTH DAILY 90 tablet 3  . citalopram (CELEXA) 20 MG tablet Take 1 tablet (20 mg total) by mouth daily. 90 tablet 3  . hydrochlorothiazide (HYDRODIURIL) 25 MG tablet TAKE 1 TABLET(25 MG) BY MOUTH DAILY 90 tablet 3  . lovastatin (MEVACOR) 20 MG tablet Take 1 tablet (20 mg total) by mouth at bedtime. 90 tablet 3  . OVER THE COUNTER MEDICATION Vitamin D 3 5000 mg taking daily     No current facility-administered medications on file prior to visit.     Review of Systems:  As per HPI- otherwise negative.   Physical Examination: Vitals:   07/08/18 1036  BP: 140/90  Pulse:  67  Resp: 16  Temp: 97.9 F (36.6 C)  SpO2: 98%   Vitals:   07/08/18 1036  Weight: 239 lb (108.4 kg)  Height: 5\' 9"  (1.753 m)   Body mass index is 35.29 kg/m. Ideal Body Weight: Weight in (lb) to have BMI = 25: 168.9  GEN: WDWN, NAD, Non-toxic, A & O x 3, stocky build, looks well  HEENT: Atraumatic, Normocephalic. Neck supple. No masses, No LAD.  Bilateral TM wnl, oropharynx normal.  PEERL,EOMI.   Left external ear shows healing abrasion but is is slighty erythematous and warm.  No hematoma Ears and Nose: No external deformity. CV: RRR, No M/G/R. No JVD. No thrill. No extra heart sounds. PULM: CTA B, no wheezes, crackles, rhonchi. No retractions. No resp. distress. No accessory muscle use. EXTR: No c/c/e NEURO Normal gait.  PSYCH: Normally interactive.  Conversant. Not depressed or anxious appearing.  Calm demeanor.  Skin overlying Left lateral clavicle displays a bruise and is tender.  He has tenderness over the distal clavicle and AC joint but no deformity is felt Pt has large musculature of the pec and trapezius muscle which makes direct palpation of the [facility] joint challenging  The left shoulder however is non- tender with full ROM and strength testing  No significant pain with cross over testing   Assessment and Plan: Contusion of left shoulder, initial encounter - Plan: DG Clavicle Left, DG Shoulder Left  Abrasion of antihelix of left ear, initial encounter - Plan: cephALEXin (KEFLEX) 500 MG capsule  Need for influenza vaccination - Plan: Flu Vaccine QUAD 6+ mos PF IM (Fluarix Quad PF)  Abrasion to ear that may have a mild cellulitis component Treat with keflex Flu shot given today Contusion to shoulder- the joint seems to be unharmed but he may have a separation, less likely a clavicle fracture  Obtain films today   Signed Lamar Blinks, MD  Received his x-ray reports: Dg Clavicle Left  Result Date: 07/08/2018 CLINICAL DATA:  Direct blow to left shoulder.  Pain. EXAM: LEFT CLAVICLE - 2+ VIEWS COMPARISON:  Chest x-ray 07/05/2009 FINDINGS: Degenerative changes in the left [facility] joint. There is AC joint widening relative to the appearance on prior chest x-ray. This is concerning for separation. No visible fracture. IMPRESSION: Widening of the left AC joint. This is concerning for [facility] joint separation. Electronically Signed   By: Rolm Baptise M.D.   On: 07/08/2018 11:14   Dg Shoulder Left  Result Date: 07/08/2018 CLINICAL DATA:  Direct blow to left shoulder.  Pain. EXAM: LEFT SHOULDER - 2+ VIEW COMPARISON:  Left clavicle series performed today FINDINGS: [facility] joint widening noted on the clavicle series is not appreciated on this shoulder series. Mild degenerative changes in the left AC and glenohumeral joints. No visible fracture.  IMPRESSION: The widening noted on clavicle series performed today not appreciated on this study. Degenerative changes in the left AC and glenohumeral joints. Electronically Signed   By: Rolm Baptise M.D.   On: 07/08/2018 11:14   Called pt- possible mild separation, no fracture.  As he is getting better continue rated return to full activity.  He is already significantly improved.  Certainly can see his ortho for follow-up but he declines for now

## 2018-07-08 ENCOUNTER — Ambulatory Visit: Payer: BLUE CROSS/BLUE SHIELD | Admitting: Family Medicine

## 2018-07-08 ENCOUNTER — Encounter: Payer: Self-pay | Admitting: Family Medicine

## 2018-07-08 ENCOUNTER — Ambulatory Visit (HOSPITAL_BASED_OUTPATIENT_CLINIC_OR_DEPARTMENT_OTHER)
Admission: RE | Admit: 2018-07-08 | Discharge: 2018-07-08 | Disposition: A | Discharge status: Home / Self Care | Payer: BLUE CROSS/BLUE SHIELD | Source: Ambulatory Visit | Attending: Family Medicine | Admitting: Family Medicine

## 2018-07-08 VITALS — BP 140/90 | HR 67 | Temp 97.9°F | Resp 16 | Ht 69.0 in | Wt 239.0 lb

## 2018-07-08 DIAGNOSIS — S40012A Contusion of left shoulder, initial encounter: Secondary | ICD-10-CM | POA: Diagnosis not present

## 2018-07-08 DIAGNOSIS — M25512 Pain in left shoulder: Secondary | ICD-10-CM | POA: Insufficient documentation

## 2018-07-08 DIAGNOSIS — S00412A Abrasion of left ear, initial encounter: Secondary | ICD-10-CM | POA: Diagnosis not present

## 2018-07-08 DIAGNOSIS — Z23 Encounter for immunization: Secondary | ICD-10-CM | POA: Diagnosis not present

## 2018-07-08 DIAGNOSIS — S4992XA Unspecified injury of left shoulder and upper arm, initial encounter: Secondary | ICD-10-CM | POA: Diagnosis not present

## 2018-07-08 MED ORDER — CEPHALEXIN 500 MG PO CAPS: 500.0000 mg | ORAL_CAPSULE | Freq: Two times a day (BID) | ORAL | 0 refills | Status: DC

## 2018-07-08 NOTE — Patient Instructions (Signed)
Flu shot given today Please have x-rays of your shoulder and clavicle taken on the ground floor- I will be in touch with your reports later on today Keflex rx for your ear- take twice a day for one week. Let me know if this does not reduce your ear symptoms

## 2018-10-10 DIAGNOSIS — L821 Other seborrheic keratosis: Secondary | ICD-10-CM | POA: Diagnosis not present

## 2018-10-10 DIAGNOSIS — D2239 Melanocytic nevi of other parts of face: Secondary | ICD-10-CM | POA: Diagnosis not present

## 2018-10-11 DIAGNOSIS — D2239 Melanocytic nevi of other parts of face: Secondary | ICD-10-CM | POA: Diagnosis not present

## 2018-10-17 ENCOUNTER — Telehealth: Payer: Self-pay | Admitting: *Deleted

## 2018-10-17 NOTE — Telephone Encounter (Signed)
Received Dermatopathology Report results from [facility]; forwarded to provider/SLS 02/20

## 2019-02-28 ENCOUNTER — Telehealth: Payer: Self-pay | Admitting: Family Medicine

## 2019-02-28 NOTE — Telephone Encounter (Signed)
Patient's wife is calling to schedule CPE in office Please advise [phone]

## 2019-03-04 NOTE — Telephone Encounter (Signed)
DONE

## 2019-04-01 DIAGNOSIS — H903 Sensorineural hearing loss, bilateral: Secondary | ICD-10-CM | POA: Diagnosis not present

## 2019-04-01 DIAGNOSIS — H9120 Sudden idiopathic hearing loss, unspecified ear: Secondary | ICD-10-CM | POA: Diagnosis not present

## 2019-04-14 NOTE — Patient Instructions (Addendum)
It was good to see you today, I will be in touch with your labs We will see how your cholesterol looks and consider medication if needed  Flu shot given today I will be in touch with your labs asap Your BP is reasonable    I gave you some robaxin to use as needed for your back pain- this is a muscle relaxer. It can make you a bit drowsy, please be cautious    Health Maintenance, Male Adopting a healthy lifestyle and getting preventive care are important in promoting health and wellness. Ask your health care provider about:  The right schedule for you to have regular tests and exams.  Things you can do on your own to prevent diseases and keep yourself healthy. What should I know about diet, weight, and exercise? Eat a healthy diet   Eat a diet that includes plenty of vegetables, fruits, low-fat dairy products, and lean protein.  Do not eat a lot of foods that are high in solid fats, added sugars, or sodium. Maintain a healthy weight Body mass index (BMI) is a measurement that can be used to identify possible weight problems. It estimates body fat based on height and weight. Your health care provider can help determine your BMI and help you achieve or maintain a healthy weight. Get regular exercise Get regular exercise. This is one of the most important things you can do for your health. Most adults should:  Exercise for at least 150 minutes each week. The exercise should increase your heart rate and make you sweat (moderate-intensity exercise).  Do strengthening exercises at least twice a week. This is in addition to the moderate-intensity exercise.  Spend less time sitting. Even light physical activity can be beneficial. Watch cholesterol and blood lipids Have your blood tested for lipids and cholesterol at 60 years of age, then have this test every 5 years. You may need to have your cholesterol levels checked more often if:  Your lipid or cholesterol levels are high.  You are  older than 60 years of age.  You are at high risk for heart disease. What should I know about cancer screening? Many types of cancers can be detected early and may often be prevented. Depending on your health history and family history, you may need to have cancer screening at various ages. This may include screening for:  Colorectal cancer.  Prostate cancer.  Skin cancer.  Lung cancer. What should I know about heart disease, diabetes, and high blood pressure? Blood pressure and heart disease  High blood pressure causes heart disease and increases the risk of stroke. This is more likely to develop in people who have high blood pressure readings, are of African descent, or are overweight.  Talk with your health care provider about your target blood pressure readings.  Have your blood pressure checked: ? Every 3-5 years if you are 70-81 years of age. ? Every year if you are 5 years old or older.  If you are between the ages of 51 and 41 and are a current or former smoker, ask your health care provider if you should have a one-time screening for abdominal aortic aneurysm (AAA). Diabetes Have regular diabetes screenings. This checks your fasting blood sugar level. Have the screening done:  Once every three years after age 26 if you are at a normal weight and have a low risk for diabetes.  More often and at a younger age if you are overweight or have a high risk  for diabetes. What should I know about preventing infection? Hepatitis B If you have a higher risk for hepatitis B, you should be screened for this virus. Talk with your health care provider to find out if you are at risk for hepatitis B infection. Hepatitis C Blood testing is recommended for:  Everyone born from [birth year] through 1967.  Anyone with known risk factors for hepatitis C. Sexually transmitted infections (STIs)  You should be screened each year for STIs, including gonorrhea and chlamydia, if: ? You are sexually  active and are younger than 60 years of age. ? You are older than 60 years of age and your health care provider tells you that you are at risk for this type of infection. ? Your sexual activity has changed since you were last screened, and you are at increased risk for chlamydia or gonorrhea. Ask your health care provider if you are at risk.  Ask your health care provider about whether you are at high risk for HIV. Your health care provider may recommend a prescription medicine to help prevent HIV infection. If you choose to take medicine to prevent HIV, you should first get tested for HIV. You should then be tested every 3 months for as long as you are taking the medicine. Follow these instructions at home: Lifestyle  Do not use any products that contain nicotine or tobacco, such as cigarettes, e-cigarettes, and chewing tobacco. If you need help quitting, ask your health care provider.  Do not use street drugs.  Do not share needles.  Ask your health care provider for help if you need support or information about quitting drugs. Alcohol use  Do not drink alcohol if your health care provider tells you not to drink.  If you drink alcohol: ? Limit how much you have to 0-2 drinks a day. ? Be aware of how much alcohol is in your drink. In the [place] one drink equals one 12 oz bottle of beer (355 mL), one 5 oz glass of wine (148 mL), or one 1 oz glass of hard liquor (44 mL). General instructions  Schedule regular health, dental, and eye exams.  Stay current with your vaccines.  Tell your health care provider if: ? You often feel depressed. ? You have ever been abused or do not feel safe at home. Summary  Adopting a healthy lifestyle and getting preventive care are important in promoting health and wellness.  Follow your health care provider's instructions about healthy diet, exercising, and getting tested or screened for diseases.  Follow your health care provider's instructions on  monitoring your cholesterol and blood pressure. This information is not intended to replace advice given to you by your health care provider. Make sure you discuss any questions you have with your health care provider. Document Released: 02/10/2008 Document Revised: 08/07/2018 Document Reviewed: 08/07/2018 Elsevier Patient Education  2020 [employer].

## 2019-04-14 NOTE — Progress Notes (Addendum)
[place] at [facility] [place], [place], [place] [address] [phone]  Date:  04/17/2019   Name:  Henry Wallace   DOB:  1961/05/27   MRN:  [id number]  PCP:  Darreld Mclean, MD    Chief Complaint: Annual Exam   History of Present Illness:  [name] is a 58 y.o. very pleasant male patient who presents with the following:  Here today for physical exam They are spending most of their time at [facility] during the pandemic  History of hypertension, obesity, depression I last saw him in November for shoulder injury- we did x-rays which showed possible mild separation This healed ok His lower back does ache some of the time after he does hard work He may notice a sense of feeling fatigued and less motivated, reduced sexual desire His physical energy is not as good  He sleeps pretty well but he does snore if he drinks alcohol- never had a sleep study If he does not drink alcohol he snores less - he has cut way down on alcohol the last few months His wife has not noted any apneic spells during the night   Married to [place], adult son [name] He works for window and Technical sales engineer company Most recent labs 1 year ago- will do today, he is fasting  Colonoscopy done 2016- 10 year follow up Shingrix is complete Can give flu shot today- will do today   Per visit notes from 1 year ago: His wife [name] contributes that he is having some sx of depression, esp since his father passed away  Sometimes he will seem to use alcohol to treat this- he cut back a couple of weeks ago when she told him that she was concerned Darrell does feel like his drinking can be a problem as well and is glad that he cut back again  [name] notes that as he is drinking less he seems to be more cheerful He is able to get to sleep ok but is awoken easily at night  They note that their relationship is good, but he does not have a high sex drive like  in the past  He really misses his dad- he was a sounding board for him and he misses being able to confide in him His friends are more busy as well- this has been a stress for him   [name] offers several concerns about Lovelle's mood and mental state today. Treyshon seems a bit annoyed and tells me that he is actually feeling ok, he just "does not talk all the time like she does, sometimes I need to be quiet"  He denies any SI He feels more like he is just tired but not so much depressed He is not exercising much due to his knee issues and has put on weight   At that time I encouraged him to cut down on drinking, and we plan to switch from Celexa to Wellbutrin-however he ended up staying with Celexa in the end  Amlodipine 10 celexa 20- he is taking this  hctz 25 lovastatin 20- not taking, will check on his lipids today  Patient Active Problem List   Diagnosis Date Noted  . MDD (major depressive disorder), recurrent, in full remission ([place]) 11/04/2015  . OBESITY, UNSPECIFIED 07/06/2009  . HYPERTENSION, BENIGN 07/06/2009  . CHEST PAIN, PRECORDIAL 07/06/2009    Past Medical History:  Diagnosis Date  . Hypertension  Past Surgical History:  Procedure Laterality Date  . HERNIA REPAIR    . PROSTATE SURGERY    . VASECTOMY      Social History   Tobacco Use  . Smoking status: Never Smoker  . Smokeless tobacco: Never Used  Substance Use Topics  . Alcohol use: Yes    Alcohol/week: 0.0 standard drinks    Comment: 8  . Drug use: No    Family History  Problem Relation Age of Onset  . Diabetes Maternal Grandmother   . Cancer Maternal Grandfather   . Heart disease Paternal Grandfather     No Known Allergies  Medication list has been reviewed and updated.  Current Outpatient Medications on File Prior to Visit  Medication Sig Dispense Refill  . amLODipine (NORVASC) 10 MG tablet TAKE 1 TABLET(10 MG) BY MOUTH DAILY 90 tablet 3  . hydrochlorothiazide (HYDRODIURIL) 25 MG tablet  TAKE 1 TABLET(25 MG) BY MOUTH DAILY 90 tablet 3  . OVER THE COUNTER MEDICATION Vitamin D 3 5000 mg taking daily     No current facility-administered medications on file prior to visit.     Review of Systems:  As per HPI- otherwise negative.   Physical Examination: Vitals:   04/17/19 0907  BP: 124/88  Pulse: 63  Resp: 16  Temp: 97.8 F (36.6 C)  SpO2: 98%   Vitals:   04/17/19 0907  Weight: 235 lb (106.6 kg)  Height: 5\' 9"  (1.753 m)   Body mass index is 34.7 kg/m. Ideal Body Weight: Weight in (lb) to have BMI = 25: 168.9  GEN: WDWN, NAD, Non-toxic, A & O x 3, overweight, looks well  HEENT: Atraumatic, Normocephalic. Neck supple. No masses, No LAD.  TM wnl Ears and Nose: No external deformity. CV: RRR, No M/G/R. No JVD. No thrill. No extra heart sounds. PULM: CTA B, no wheezes, crackles, rhonchi. No retractions. No resp. distress. No accessory muscle use. ABD: S, NT, ND, +BS. No rebound. No HSM. EXTR: No c/c/e NEURO Normal gait.  PSYCH: Normally interactive. Conversant. Not depressed or anxious appearing.  Calm demeanor.   No CP or SOB with vigorous exercise   Wt Readings from Last 3 Encounters:  04/17/19 235 lb (106.6 kg)  07/08/18 239 lb (108.4 kg)  04/10/18 236 lb (107 kg)   BP Readings from Last 3 Encounters:  04/17/19 124/88  07/08/18 140/90  04/10/18 130/84   EKG for baseline and HTN: not done as machine not working  He did have an EKG in 2010 but I cannot view this image   Assessment and Plan:   ICD-10-CM   1. Physical exam  Z00.00   2. Dyslipidemia  E78.5 Lipid panel  3. Screening for diabetes mellitus  Z13.1 Hemoglobin A1c  4. Screening for deficiency anemia  Z13.0 CBC  5. Screening for prostate cancer  Z12.5 PSA  6. Essential hypertension  I10 CBC    Comprehensive metabolic panel    amLODipine (NORVASC) 10 MG tablet    hydrochlorothiazide (HYDRODIURIL) 25 MG tablet    EKG 12-Lead  7. Major depressive disorder with single episode, in remission  ([place])  F32.5 citalopram (CELEXA) 20 MG tablet  8. Fatigue, unspecified type  R53.83 TSH    Vitamin D (25 hydroxy)    Testosterone Total,Free,Bio, Males-([facility])  9. 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     Follow-up: No follow-ups on file.  Meds ordered this encounter  Medications  . amLODipine (NORVASC) 10 MG tablet    Sig: TAKE 1 TABLET(10 MG) BY MOUTH DAILY    Dispense:  90 tablet    Refill:  3  . hydrochlorothiazide (HYDRODIURIL) 25 MG tablet    Sig: TAKE 1 TABLET(25 MG) BY MOUTH DAILY    Dispense:  90 tablet    Refill:  3  . citalopram (CELEXA) 20 MG tablet    Sig: Take 1 tablet (20 mg total) by mouth daily.    Dispense:  90 tablet    Refill:  3  . methocarbamol (ROBAXIN) 500 MG tablet    Sig: Take 1 tablet (500 mg total) by mouth every 8 (eight) hours as needed for muscle spasms.    Dispense:  30 tablet    Refill:  1   Orders Placed This Encounter  Procedures  . CBC  . Comprehensive metabolic panel  . Hemoglobin A1c  . Lipid panel  . PSA  . TSH  . Vitamin D (25 hydroxy)  . Testosterone Total,Free,Bio, Males-([facility])  . EKG 12-Lead    @SIGN @    Signed Lamar Blinks, MD  Received his labs, message to patient  Results for orders placed or performed in visit on 04/17/19  CBC  Result Value Ref Range   WBC 6.0 4.0 - 10.5 K/uL   RBC 4.64 4.22 - 5.81 Mil/uL   Platelets 236.0 150.0 - 400.0 K/uL   Hemoglobin 14.8 13.0 - 17.0 g/dL   HCT 42.8 39.0 - 52.0 %   MCV 92.2 78.0 - 100.0 fl   MCHC 34.5 30.0 - 36.0 g/dL   RDW 12.8 11.5 - 15.5 %  Comprehensive metabolic panel  Result Value Ref  Range   Sodium 141 135 - 145 mEq/L   Potassium 4.1 3.5 - 5.1 mEq/L   Chloride 102 96 - 112 mEq/L   CO2 30 19 - 32 mEq/L   Glucose, Bld 124 (H) 70 - 99 mg/dL   BUN 19 6 - 23 mg/dL   Creatinine, Ser 1.03 0.40 - 1.50 mg/dL   Total Bilirubin 0.5 0.2 - 1.2 mg/dL   Alkaline Phosphatase 81 39 - 117 U/L   AST 18 0 - 37 U/L   ALT 30 0 - 53 U/L   Total Protein 6.9 6.0 - 8.3 g/dL   Albumin 5.1 3.5 - 5.2 g/dL   Calcium 9.9 8.4 - 10.5 mg/dL   GFR 73.72 >60.00 mL/min  Hemoglobin A1c  Result Value Ref Range   Hgb A1c MFr Bld 5.5 4.6 - 6.5 %  Lipid panel  Result Value Ref Range   Cholesterol 174 0 - 200 mg/dL   Triglycerides 140.0 0.0 - 149.0 mg/dL   HDL 34.20 (L) >39.00 mg/dL   VLDL 28.0 0.0 - 40.0 mg/dL   LDL Cholesterol 111 (H) 0 - 99 mg/dL   Total CHOL/HDL Ratio 5    NonHDL 139.36   PSA  Result Value Ref Range   PSA 1.73 0.10 - 4.00 ng/mL  TSH  Result Value Ref Range   TSH 2.04 0.35 - 4.50 uIU/mL  Vitamin D (25 hydroxy)  Result Value Ref Range   VITD 93.66 30.00 - 100.00 ng/mL  Testosterone Total,Free,Bio, Males-([facility])  Result Value Ref Range   Testosterone 286 250 - 827 ng/dL   Albumin 4.7 3.6 - 5.1 g/dL   Sex Hormone Binding 25 22 - 77 nmol/L   Testosterone, Free 44.1 (L) 46.0 - 224.0 pg/mL   Testosterone, Bioavailable 94.6 (L) 110.0 - 575 ng/dL

## 2019-04-17 ENCOUNTER — Other Ambulatory Visit: Payer: Self-pay

## 2019-04-17 ENCOUNTER — Encounter: Payer: Self-pay | Admitting: Family Medicine

## 2019-04-17 ENCOUNTER — Ambulatory Visit (INDEPENDENT_AMBULATORY_CARE_PROVIDER_SITE_OTHER): Payer: BC Managed Care – PPO | Admitting: Family Medicine

## 2019-04-17 VITALS — BP 124/88 | HR 63 | Temp 97.8°F | Resp 16 | Ht 69.0 in | Wt 235.0 lb

## 2019-04-17 DIAGNOSIS — Z0000 Encounter for general adult medical examination without abnormal findings: Secondary | ICD-10-CM | POA: Diagnosis not present

## 2019-04-17 DIAGNOSIS — Z130 Encounter for screening for diseases of the blood and blood-forming organs and certain disorders involving the immune mechanism: Secondary | ICD-10-CM

## 2019-04-17 DIAGNOSIS — Z5181 Encounter for therapeutic drug level monitoring: Secondary | ICD-10-CM

## 2019-04-17 DIAGNOSIS — F325 Major depressive disorder, single episode, in full remission: Secondary | ICD-10-CM

## 2019-04-17 DIAGNOSIS — Z131 Encounter for screening for diabetes mellitus: Secondary | ICD-10-CM

## 2019-04-17 DIAGNOSIS — R5383 Other fatigue: Secondary | ICD-10-CM

## 2019-04-17 DIAGNOSIS — M5442 Lumbago with sciatica, left side: Secondary | ICD-10-CM

## 2019-04-17 DIAGNOSIS — I10 Essential (primary) hypertension: Secondary | ICD-10-CM | POA: Diagnosis not present

## 2019-04-17 DIAGNOSIS — Z23 Encounter for immunization: Secondary | ICD-10-CM | POA: Diagnosis not present

## 2019-04-17 DIAGNOSIS — E785 Hyperlipidemia, unspecified: Secondary | ICD-10-CM

## 2019-04-17 DIAGNOSIS — G8929 Other chronic pain: Secondary | ICD-10-CM

## 2019-04-17 DIAGNOSIS — Z125 Encounter for screening for malignant neoplasm of prostate: Secondary | ICD-10-CM | POA: Diagnosis not present

## 2019-04-17 DIAGNOSIS — R6882 Decreased libido: Secondary | ICD-10-CM

## 2019-04-17 LAB — LIPID PANEL
Cholesterol: 174 mg/dL (ref 0–200)
HDL: 34.2 mg/dL — ABNORMAL LOW (ref >39.00)
LDL Cholesterol: 111 mg/dL — ABNORMAL HIGH (ref 0–99)
NonHDL: 139.36
Total CHOL/HDL Ratio: 5
Triglycerides: 140 mg/dL (ref 0.0–149.0)
VLDL: 28 mg/dL (ref 0.0–40.0)

## 2019-04-17 LAB — COMPREHENSIVE METABOLIC PANEL
ALT: 30 U/L (ref 0–53)
AST: 18 U/L (ref 0–37)
Albumin: 5.1 g/dL (ref 3.5–5.2)
Alkaline Phosphatase: 81 U/L (ref 39–117)
BUN: 19 mg/dL (ref 6–23)
CO2: 30 mEq/L (ref 19–32)
Calcium: 9.9 mg/dL (ref 8.4–10.5)
Chloride: 102 mEq/L (ref 96–112)
Creatinine, Ser: 1.03 mg/dL (ref 0.40–1.50)
GFR: 73.72 mL/min (ref >60.00)
Glucose, Bld: 124 mg/dL — ABNORMAL HIGH (ref 70–99)
Potassium: 4.1 mEq/L (ref 3.5–5.1)
Sodium: 141 mEq/L (ref 135–145)
Total Bilirubin: 0.5 mg/dL (ref 0.2–1.2)
Total Protein: 6.9 g/dL (ref 6.0–8.3)

## 2019-04-17 LAB — TSH: TSH: 2.04 u[IU]/mL (ref 0.35–4.50)

## 2019-04-17 LAB — CBC
HCT: 42.8 % (ref 39.0–52.0)
Hemoglobin: 14.8 g/dL (ref 13.0–17.0)
MCHC: 34.5 g/dL (ref 30.0–36.0)
MCV: 92.2 fl (ref 78.0–100.0)
Platelets: 236 10*3/uL (ref 150.0–400.0)
RBC: 4.64 Mil/uL (ref 4.22–5.81)
RDW: 12.8 % (ref 11.5–15.5)
WBC: 6 10*3/uL (ref 4.0–10.5)

## 2019-04-17 LAB — HEMOGLOBIN A1C: Hgb A1c MFr Bld: 5.5 % (ref 4.6–6.5)

## 2019-04-17 LAB — VITAMIN D 25 HYDROXY (VIT D DEFICIENCY, FRACTURES): VITD: 93.66 ng/mL (ref 30.00–100.00)

## 2019-04-17 LAB — PSA: PSA: 1.73 ng/mL (ref 0.10–4.00)

## 2019-04-17 MED ORDER — METHOCARBAMOL 500 MG PO TABS: 500.0000 mg | ORAL_TABLET | Freq: Three times a day (TID) | ORAL | 1 refills | Status: DC | PRN

## 2019-04-17 MED ORDER — HYDROCHLOROTHIAZIDE 25 MG PO TABS: ORAL_TABLET | ORAL | 3 refills | Status: DC

## 2019-04-17 MED ORDER — CITALOPRAM HYDROBROMIDE 20 MG PO TABS: 20.0000 mg | ORAL_TABLET | Freq: Every day | ORAL | 3 refills | Status: DC

## 2019-04-17 MED ORDER — AMLODIPINE BESYLATE 10 MG PO TABS: ORAL_TABLET | ORAL | 3 refills | Status: DC

## 2019-04-18 LAB — TESTOSTERONE TOTAL,FREE,BIO, MALES
Albumin: 4.7 g/dL (ref 3.6–5.1)
Sex Hormone Binding: 25 nmol/L (ref 22–77)
Testosterone, Bioavailable: 94.6 ng/dL — ABNORMAL LOW (ref >=110.0)
Testosterone, Free: 44.1 pg/mL — ABNORMAL LOW (ref 46.0–224.0)
Testosterone: 286 ng/dL (ref 250–827)

## 2019-04-21 ENCOUNTER — Encounter: Payer: Self-pay | Admitting: Family Medicine

## 2019-04-21 MED ORDER — SIMVASTATIN 10 MG PO TABS: 10.0000 mg | ORAL_TABLET | Freq: Every day | ORAL | 3 refills | Status: DC

## 2019-04-22 ENCOUNTER — Encounter: Payer: Self-pay | Admitting: Family Medicine

## 2019-04-22 ENCOUNTER — Other Ambulatory Visit: Payer: Self-pay | Admitting: Family Medicine

## 2019-04-22 DIAGNOSIS — E291 Testicular hypofunction: Secondary | ICD-10-CM

## 2019-07-13 ENCOUNTER — Other Ambulatory Visit: Payer: Self-pay | Admitting: Family Medicine

## 2019-07-13 DIAGNOSIS — I10 Essential (primary) hypertension: Secondary | ICD-10-CM

## 2019-10-29 DIAGNOSIS — M25512 Pain in left shoulder: Secondary | ICD-10-CM | POA: Diagnosis not present

## 2019-10-29 DIAGNOSIS — M542 Cervicalgia: Secondary | ICD-10-CM | POA: Diagnosis not present

## 2019-11-04 ENCOUNTER — Other Ambulatory Visit: Payer: Self-pay | Admitting: Family Medicine

## 2019-11-04 DIAGNOSIS — Z125 Encounter for screening for malignant neoplasm of prostate: Secondary | ICD-10-CM

## 2019-11-04 DIAGNOSIS — Z131 Encounter for screening for diabetes mellitus: Secondary | ICD-10-CM

## 2019-11-04 DIAGNOSIS — Z130 Encounter for screening for diseases of the blood and blood-forming organs and certain disorders involving the immune mechanism: Secondary | ICD-10-CM

## 2019-11-04 DIAGNOSIS — E785 Hyperlipidemia, unspecified: Secondary | ICD-10-CM

## 2019-11-24 ENCOUNTER — Ambulatory Visit: Payer: BC Managed Care – PPO | Attending: Internal Medicine

## 2019-11-24 DIAGNOSIS — Z23 Encounter for immunization: Secondary | ICD-10-CM

## 2019-11-24 NOTE — Progress Notes (Signed)
   Covid-19 Vaccination Clinic  Name:  BO STORMENT    MRN: [id number] DOB: [birth year]  11/24/2019  Henry Wallace was observed post Covid-19 immunization for 15 minutes without incident. He was provided with Vaccine Information Sheet and instruction to access the V-Safe system.   Mr. Juaire was instructed to call 911 with any severe reactions post vaccine: Marland Kitchen Difficulty breathing  . Swelling of face and throat  . A fast heartbeat  . A bad rash all over body  . Dizziness and weakness   Immunizations Administered    Name Date Dose VIS Date Route   Pfizer COVID-19 Vaccine 11/24/2019  4:44 PM 0.3 mL 08/08/2019 Intramuscular   Manufacturer: [place]   Lot: [id number]   [place]: [id number]

## 2019-12-17 ENCOUNTER — Ambulatory Visit: Payer: BC Managed Care – PPO | Attending: Internal Medicine

## 2019-12-17 DIAGNOSIS — Z23 Encounter for immunization: Secondary | ICD-10-CM

## 2019-12-17 NOTE — Progress Notes (Signed)
   Covid-19 Vaccination Clinic  Name:  Henry Wallace    MRN: [id number] DOB: 04-Mar-1961  12/17/2019  [name] was observed post Covid-19 immunization for 15 minutes without incident. He was provided with Vaccine Information Sheet and instruction to access the V-Safe system.   [name] was instructed to call 911 with any severe reactions post vaccine: Marland Kitchen Difficulty breathing  . Swelling of face and throat  . A fast heartbeat  . A bad rash all over body  . Dizziness and weakness   Immunizations Administered    Name Date Dose VIS Date Route   Pfizer COVID-19 Vaccine 12/17/2019  2:32 PM 0.3 mL 10/22/2018 Intramuscular   Manufacturer: [place]   Lot: [id number]   [place]: [id number]

## 2020-02-16 ENCOUNTER — Other Ambulatory Visit: Payer: Self-pay

## 2020-02-16 DIAGNOSIS — Z125 Encounter for screening for malignant neoplasm of prostate: Secondary | ICD-10-CM

## 2020-02-16 DIAGNOSIS — Z130 Encounter for screening for diseases of the blood and blood-forming organs and certain disorders involving the immune mechanism: Secondary | ICD-10-CM

## 2020-02-16 DIAGNOSIS — Z131 Encounter for screening for diabetes mellitus: Secondary | ICD-10-CM

## 2020-02-16 DIAGNOSIS — E785 Hyperlipidemia, unspecified: Secondary | ICD-10-CM

## 2020-03-26 ENCOUNTER — Other Ambulatory Visit: Payer: Self-pay | Admitting: Family Medicine

## 2020-03-26 DIAGNOSIS — F325 Major depressive disorder, single episode, in full remission: Secondary | ICD-10-CM

## 2020-04-16 NOTE — Patient Instructions (Addendum)
Great to see you again today!  I will be in touch with your labs as soon as possible Let's get you a sleep study to see if you may have sleep apnea as an explanation for your fatigue and snoring  Your BP looks great.  covid booster later this year     Health Maintenance, Male Adopting a healthy lifestyle and getting preventive care are important in promoting health and wellness. Ask your health care provider about:  The right schedule for you to have regular tests and exams.  Things you can do on your own to prevent diseases and keep yourself healthy. What should I know about diet, weight, and exercise? Eat a healthy diet   Eat a diet that includes plenty of vegetables, fruits, low-fat dairy products, and lean protein.  Do not eat a lot of foods that are high in solid fats, added sugars, or sodium. Maintain a healthy weight Body mass index (BMI) is a measurement that can be used to identify possible weight problems. It estimates body fat based on height and weight. Your health care provider can help determine your BMI and help you achieve or maintain a healthy weight. Get regular exercise Get regular exercise. This is one of the most important things you can do for your health. Most adults should:  Exercise for at least 150 minutes each week. The exercise should increase your heart rate and make you sweat (moderate-intensity exercise).  Do strengthening exercises at least twice a week. This is in addition to the moderate-intensity exercise.  Spend less time sitting. Even light physical activity can be beneficial. Watch cholesterol and blood lipids Have your blood tested for lipids and cholesterol at 60 years of age, then have this test every 5 years. You may need to have your cholesterol levels checked more often if:  Your lipid or cholesterol levels are high.  You are older than 61 years of age.  You are at high risk for heart disease. What should I know about cancer  screening? Many types of cancers can be detected early and may often be prevented. Depending on your health history and family history, you may need to have cancer screening at various ages. This may include screening for:  Colorectal cancer.  Prostate cancer.  Skin cancer.  Lung cancer. What should I know about heart disease, diabetes, and high blood pressure? Blood pressure and heart disease  High blood pressure causes heart disease and increases the risk of stroke. This is more likely to develop in people who have high blood pressure readings, are of African descent, or are overweight.  Talk with your health care provider about your target blood pressure readings.  Have your blood pressure checked: ? Every 3-5 years if you are 77-28 years of age. ? Every year if you are 36 years old or older.  If you are between the ages of 50 and 61 and are a current or former smoker, ask your health care provider if you should have a one-time screening for abdominal aortic aneurysm (AAA). Diabetes Have regular diabetes screenings. This checks your fasting blood sugar level. Have the screening done:  Once every three years after age 66 if you are at a normal weight and have a low risk for diabetes.  More often and at a younger age if you are overweight or have a high risk for diabetes. What should I know about preventing infection? Hepatitis B If you have a higher risk for hepatitis B, you should be  screened for this virus. Talk with your health care provider to find out if you are at risk for hepatitis B infection. Hepatitis C Blood testing is recommended for:  Everyone born from [birth year] through 1967.  Anyone with known risk factors for hepatitis C. Sexually transmitted infections (STIs)  You should be screened each year for STIs, including gonorrhea and chlamydia, if: ? You are sexually active and are younger than 61 years of age. ? You are older than 61 years of age and your health care  provider tells you that you are at risk for this type of infection. ? Your sexual activity has changed since you were last screened, and you are at increased risk for chlamydia or gonorrhea. Ask your health care provider if you are at risk.  Ask your health care provider about whether you are at high risk for HIV. Your health care provider may recommend a prescription medicine to help prevent HIV infection. If you choose to take medicine to prevent HIV, you should first get tested for HIV. You should then be tested every 3 months for as long as you are taking the medicine. Follow these instructions at home: Lifestyle  Do not use any products that contain nicotine or tobacco, such as cigarettes, e-cigarettes, and chewing tobacco. If you need help quitting, ask your health care provider.  Do not use street drugs.  Do not share needles.  Ask your health care provider for help if you need support or information about quitting drugs. Alcohol use  Do not drink alcohol if your health care provider tells you not to drink.  If you drink alcohol: ? Limit how much you have to 0-2 drinks a day. ? Be aware of how much alcohol is in your drink. In the [place] one drink equals one 12 oz bottle of beer (355 mL), one 5 oz glass of wine (148 mL), or one 1 oz glass of hard liquor (44 mL). General instructions  Schedule regular health, dental, and eye exams.  Stay current with your vaccines.  Tell your health care provider if: ? You often feel depressed. ? You have ever been abused or do not feel safe at home. Summary  Adopting a healthy lifestyle and getting preventive care are important in promoting health and wellness.  Follow your health care provider's instructions about healthy diet, exercising, and getting tested or screened for diseases.  Follow your health care provider's instructions on monitoring your cholesterol and blood pressure. This information is not intended to replace advice given  to you by your health care provider. Make sure you discuss any questions you have with your health care provider. Document Revised: 08/07/2018 Document Reviewed: 08/07/2018 Elsevier Patient Education  2020 [employer].

## 2020-04-16 NOTE — Progress Notes (Addendum)
[place] at [facility] [address] [place], [place] [address] [phone]  Date:  04/19/2020   Name:  Henry Wallace   DOB:  Feb 03, 1961   MRN:  [id number]  PCP:  Darreld Mclean, MD    Chief Complaint: Annual Exam   History of Present Illness:  Henry Wallace is a 59 y.o. 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    Labs one year ago Colon UTD covid series done shingrix done  He had low T at last visit, we referred him to urology but I don't see a note where he was seen. He did not end up being seen for this issue We also started zocor 10 last year- can check on effect today  amlodoipne 10 celexa 20 hctz 25 zocor 10- not taking actually, he and his wife have changed their diets and he hopes his numbers will look ok  Wt Readings from Last 3 Encounters:  04/19/20 238 lb (108 kg)  04/17/19 235 lb (106.6 kg)  07/08/18 239 lb (108.4 kg)   They started a new diet in December, however he has not been able to lose any weight and is frustrated. Loretta admits he feels somewhat frustrated and less excited about life.  He feels more tired Snoring is worse, his wife is worried about OSA.  She has noted him jerking more than normal, but no apnea that she has noted per se  Never had a sleep study as of yet.  He is not sure if he could use CPAP but would be willing to be tested   He does do some exercise- uses his elliptical, does a couple of miles every other day He lifts a few weights to keep up his strength No CP or SOB with exercise    Patient Active Problem List   Diagnosis Date Noted  . MDD (major depressive disorder),  recurrent, in full remission ([place]) 11/04/2015  . OBESITY, UNSPECIFIED 07/06/2009  . HYPERTENSION, BENIGN 07/06/2009  . CHEST PAIN, PRECORDIAL 07/06/2009    Past Medical History:  Diagnosis Date  . Hypertension     Past Surgical History:  Procedure Laterality Date  . HERNIA REPAIR    . PROSTATE SURGERY    . VASECTOMY      Social History   Tobacco Use  . Smoking status: Never Smoker  . Smokeless tobacco: Never Used  Substance Use Topics  . Alcohol use: Yes    Alcohol/week: 0.0 standard drinks    Comment: 8  . Drug use: No    Family History  Problem Relation Age of Onset  . Diabetes Maternal Grandmother   . Cancer Maternal Grandfather   . Heart disease Paternal Grandfather     No Known Allergies  Medication list has been reviewed and updated.  Current Outpatient Medications on File Prior to Visit  Medication Sig Dispense Refill  . amLODipine (NORVASC) 10 MG tablet TAKE 1 TABLET(10 MG) BY MOUTH DAILY 90 tablet 3  . citalopram (CELEXA) 20 MG tablet TAKE 1 TABLET(20 MG) BY MOUTH DAILY 30 tablet 0  . hydrochlorothiazide (HYDRODIURIL) 25 MG tablet TAKE 1 TABLET BY MOUTH  DAILY 90 tablet 3  . OVER THE COUNTER MEDICATION Vitamin D 3 5000 mg taking daily    . simvastatin (ZOCOR) 10 MG tablet Take 1 tablet (10 mg total) by mouth at bedtime. 90 tablet 3  . methocarbamol (ROBAXIN) 500 MG tablet Take 1 tablet (500 mg total) by mouth every 8 (eight) hours as needed for muscle spasms. (Patient not taking: Reported on 04/19/2020) 30 tablet 1   No current facility-administered medications on file prior to visit.    Review of Systems:  As per HPI- otherwise negative.   Physical Examination: Vitals:   04/19/20 0916  BP: 122/82  Pulse: 62  Temp: 98.5 F (36.9 C)  SpO2: 98%   Vitals:   04/19/20 0916  Weight: 238 lb (108 kg)  Height: 5\' 9"  (1.753 m)   Body mass index is 35.15 kg/m. Ideal Body Weight: Weight in (lb) to have BMI = 25: 168.9  GEN: no acute distress.   Overweight, looks well  HEENT: Atraumatic, Normocephalic.    Bilateral TM wnl, oropharynx normal.  PEERL,EOMI.   Ears and Nose: No external deformity. CV: RRR, No M/G/R. No JVD. No thrill. No extra heart sounds. PULM: CTA B, no wheezes, crackles, rhonchi. No retractions. No resp. distress. No accessory muscle use. ABD: S, NT, ND, +BS. No rebound. No HSM. EXTR: No c/c/e PSYCH: Normally interactive. Conversant.    Assessment and Plan: Physical exam  Screening for prostate cancer - Plan: PSA  Dyslipidemia - Plan: Lipid panel  Screening for diabetes mellitus - Plan: Comprehensive metabolic panel, Hemoglobin A1c  Screening for deficiency anemia - Plan: CBC  Essential hypertension - Plan: CBC, Comprehensive metabolic panel, amLODipine (NORVASC) 10 MG tablet, hydrochlorothiazide (HYDRODIURIL) 25 MG tablet  Low energy - Plan: TSH  Snoring - Plan: Ambulatory referral to Neurology  Major depressive disorder with single episode, in remission ([place]) - Plan: citalopram (CELEXA) 20 MG tablet  Hypogonadism in male - Plan: Testosterone Total,Free,Bio, Males-([facility])  Here today for a CPE Doing well except for difficulty with weight loss, some fatigue/ lack of motivation. Will set up sleep study for him BP under ok control, refilled meds  Will see how CHL looks off statin Will plan further follow- up pending labs.  This visit occurred during the SARS-CoV-2 public health emergency.  Safety protocols were in place, including screening questions prior to the visit, additional usage of staff PPE, and extensive cleaning of exam room while observing appropriate contact time as indicated for disinfecting solutions.    Signed Lamar Blinks, MD  Addendum 8/24, received his labs as below Message to patient Results for orders placed or performed in visit on 04/19/20  CBC  Result Value Ref Range   WBC 6.0 3.8 - 10.8 Thousand/uL   RBC 4.94 4.20 - 5.80 Million/uL   Hemoglobin 15.9 13.2 - 17.1 g/dL    HCT 45.7 38 - 50 %   MCV 92.5 80.0 - 100.0 fL   MCH 32.2 27.0 - 33.0 pg   MCHC 34.8 32.0 - 36.0 g/dL   RDW 12.7 11.0 - 15.0 %   Platelets 235 140 - 400 Thousand/uL   MPV 9.4 7.5 - 12.5 fL  Comprehensive metabolic panel  Result Value Ref Range   Glucose, Bld 106 (H) 65 - 99 mg/dL   BUN 14 7 - 25 mg/dL   Creat 1.05 0.70 - 1.25 mg/dL   BUN/Creatinine Ratio NOT APPLICABLE 6 - 22 (calc)   Sodium 140 135 - 146 mmol/L   Potassium 4.1 3.5 -  5.3 mmol/L   Chloride 100 98 - 110 mmol/L   CO2 30 20 - 32 mmol/L   Calcium 9.9 8.6 - 10.3 mg/dL   Total Protein 7.1 6.1 - 8.1 g/dL   Albumin 4.8 3.6 - 5.1 g/dL   Globulin 2.3 1.9 - 3.7 g/dL (calc)   AG Ratio 2.1 1.0 - 2.5 (calc)   Total Bilirubin 0.9 0.2 - 1.2 mg/dL   Alkaline phosphatase (APISO) 77 35 - 144 U/L   AST 38 (H) 10 - 35 U/L   ALT 59 (H) 9 - 46 U/L  Hemoglobin A1c  Result Value Ref Range   Hgb A1c MFr Bld 5.5 <5.7 % of total Hgb   Mean Plasma Glucose 111 (calc)   eAG (mmol/L) 6.2 (calc)  Lipid panel  Result Value Ref Range   Cholesterol 191 <200 mg/dL   HDL 37 (L) > OR = 40 mg/dL   Triglycerides 163 (H) <150 mg/dL   LDL Cholesterol (Calc) 126 (H) mg/dL (calc)   Total CHOL/HDL Ratio 5.2 (H) <5.0 (calc)   Non-HDL Cholesterol (Calc) 154 (H) <130 mg/dL (calc)  PSA  Result Value Ref Range   PSA 1.4 < OR = 4.0 ng/mL  TSH  Result Value Ref Range   TSH 3.89 0.40 - 4.50 mIU/L

## 2020-04-19 ENCOUNTER — Other Ambulatory Visit: Payer: Self-pay

## 2020-04-19 ENCOUNTER — Encounter: Payer: Self-pay | Admitting: Family Medicine

## 2020-04-19 ENCOUNTER — Ambulatory Visit (INDEPENDENT_AMBULATORY_CARE_PROVIDER_SITE_OTHER): Payer: BC Managed Care – PPO | Admitting: Family Medicine

## 2020-04-19 VITALS — BP 122/82 | HR 62 | Temp 98.5°F | Ht 69.0 in | Wt 238.0 lb

## 2020-04-19 DIAGNOSIS — F325 Major depressive disorder, single episode, in full remission: Secondary | ICD-10-CM

## 2020-04-19 DIAGNOSIS — Z0000 Encounter for general adult medical examination without abnormal findings: Secondary | ICD-10-CM | POA: Diagnosis not present

## 2020-04-19 DIAGNOSIS — E785 Hyperlipidemia, unspecified: Secondary | ICD-10-CM

## 2020-04-19 DIAGNOSIS — E291 Testicular hypofunction: Secondary | ICD-10-CM

## 2020-04-19 DIAGNOSIS — Z125 Encounter for screening for malignant neoplasm of prostate: Secondary | ICD-10-CM | POA: Diagnosis not present

## 2020-04-19 DIAGNOSIS — I10 Essential (primary) hypertension: Secondary | ICD-10-CM | POA: Diagnosis not present

## 2020-04-19 DIAGNOSIS — Z131 Encounter for screening for diabetes mellitus: Secondary | ICD-10-CM | POA: Diagnosis not present

## 2020-04-19 DIAGNOSIS — R0683 Snoring: Secondary | ICD-10-CM

## 2020-04-19 DIAGNOSIS — R5383 Other fatigue: Secondary | ICD-10-CM | POA: Diagnosis not present

## 2020-04-19 DIAGNOSIS — Z130 Encounter for screening for diseases of the blood and blood-forming organs and certain disorders involving the immune mechanism: Secondary | ICD-10-CM

## 2020-04-19 MED ORDER — CITALOPRAM HYDROBROMIDE 20 MG PO TABS: ORAL_TABLET | ORAL | 3 refills | Status: DC

## 2020-04-19 MED ORDER — HYDROCHLOROTHIAZIDE 25 MG PO TABS: ORAL_TABLET | ORAL | 3 refills | Status: DC

## 2020-04-19 MED ORDER — AMLODIPINE BESYLATE 10 MG PO TABS: ORAL_TABLET | ORAL | 3 refills | Status: DC

## 2020-04-20 ENCOUNTER — Encounter: Payer: Self-pay | Admitting: Family Medicine

## 2020-04-20 LAB — PSA: PSA: 1.4 ng/mL (ref <=4.0)

## 2020-04-20 LAB — CBC
HCT: 45.7 % (ref 38.5–50.0)
Hemoglobin: 15.9 g/dL (ref 13.2–17.1)
MCH: 32.2 pg (ref 27.0–33.0)
MCHC: 34.8 g/dL (ref 32.0–36.0)
MCV: 92.5 fL (ref 80.0–100.0)
MPV: 9.4 fL (ref 7.5–12.5)
Platelets: 235 10*3/uL (ref 140–400)
RBC: 4.94 10*6/uL (ref 4.20–5.80)
RDW: 12.7 % (ref 11.0–15.0)
WBC: 6 10*3/uL (ref 3.8–10.8)

## 2020-04-20 LAB — COMPREHENSIVE METABOLIC PANEL
AG Ratio: 2.1 (calc) (ref 1.0–2.5)
ALT: 59 U/L — ABNORMAL HIGH (ref 9–46)
AST: 38 U/L — ABNORMAL HIGH (ref 10–35)
Albumin: 4.8 g/dL (ref 3.6–5.1)
Alkaline phosphatase (APISO): 77 U/L (ref 35–144)
BUN: 14 mg/dL (ref 7–25)
CO2: 30 mmol/L (ref 20–32)
Calcium: 9.9 mg/dL (ref 8.6–10.3)
Chloride: 100 mmol/L (ref 98–110)
Creat: 1.05 mg/dL (ref 0.70–1.25)
Globulin: 2.3 g/dL (calc) (ref 1.9–3.7)
Glucose, Bld: 106 mg/dL — ABNORMAL HIGH (ref 65–99)
Potassium: 4.1 mmol/L (ref 3.5–5.3)
Sodium: 140 mmol/L (ref 135–146)
Total Bilirubin: 0.9 mg/dL (ref 0.2–1.2)
Total Protein: 7.1 g/dL (ref 6.1–8.1)

## 2020-04-20 LAB — LIPID PANEL
Cholesterol: 191 mg/dL (ref <200)
HDL: 37 mg/dL — ABNORMAL LOW (ref >=40)
LDL Cholesterol (Calc): 126 mg/dL (calc) — ABNORMAL HIGH
Non-HDL Cholesterol (Calc): 154 mg/dL (calc) — ABNORMAL HIGH (ref <130)
Total CHOL/HDL Ratio: 5.2 (calc) — ABNORMAL HIGH (ref <5.0)
Triglycerides: 163 mg/dL — ABNORMAL HIGH (ref <150)

## 2020-04-20 LAB — HEMOGLOBIN A1C
Hgb A1c MFr Bld: 5.5 % of total Hgb (ref <5.7)
Mean Plasma Glucose: 111 (calc)
eAG (mmol/L): 6.2 (calc)

## 2020-04-20 LAB — TESTOSTERONE TOTAL,FREE,BIO, MALES
Albumin: 4.6 g/dL (ref 3.6–5.1)
Sex Hormone Binding: 31 nmol/L (ref 22–77)
Testosterone, Bioavailable: 121.7 ng/dL (ref >=110.0)
Testosterone, Free: 58 pg/mL (ref 46.0–224.0)
Testosterone: 421 ng/dL (ref 250–827)

## 2020-04-20 LAB — TSH: TSH: 3.89 mIU/L (ref 0.40–4.50)

## 2020-06-16 DIAGNOSIS — K1329 Other disturbances of oral epithelium, including tongue: Secondary | ICD-10-CM | POA: Diagnosis not present

## 2020-06-29 DIAGNOSIS — K1329 Other disturbances of oral epithelium, including tongue: Secondary | ICD-10-CM | POA: Diagnosis not present

## 2020-08-11 DIAGNOSIS — K1379 Other lesions of oral mucosa: Secondary | ICD-10-CM | POA: Diagnosis not present

## 2020-09-08 DIAGNOSIS — K1379 Other lesions of oral mucosa: Secondary | ICD-10-CM | POA: Diagnosis not present

## 2020-11-18 DIAGNOSIS — L814 Other melanin hyperpigmentation: Secondary | ICD-10-CM | POA: Diagnosis not present

## 2020-11-18 DIAGNOSIS — L821 Other seborrheic keratosis: Secondary | ICD-10-CM | POA: Diagnosis not present

## 2021-04-17 NOTE — Progress Notes (Deleted)
[place] at [facility] [address] [place], [place] [address] [phone]  Date:  04/21/2021   Name:  Henry Wallace   DOB:  [birth year]   MRN:  [id number]  PCP:  Darreld Mclean, MD    Chief Complaint: No chief complaint on file.   History of Present Illness:  Henry Wallace is a 60 y.o. 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  He has been released from care  Married to [place], adult son [name].  He works for a window and Academic librarian At our last visit they had noticed some fatigue and snoring.  Placed a neurology referral for sleep apnea testing We checked his testosterone at last visit as well, it was in normal range-he is not currently taking testosterone replacement I had suggested going back on simvastatin-which he was not taking-in addition to rechecking slightly elevated LFTs at our last visit.  However, Mikail did not follow-up regarding the suggestions  COVID-19 booster Colonoscopy up-to-date Can get labs today ?  Testosterone today  Current medications amlodipine 10, hydrochlorothiazide 25, Celexa 20 Patient Active Problem List   Diagnosis Date Noted   MDD (major depressive disorder), recurrent, in full remission ([place]) 11/04/2015   OBESITY, UNSPECIFIED 07/06/2009   HYPERTENSION, BENIGN 07/06/2009   CHEST PAIN, PRECORDIAL 07/06/2009    Past Medical History:  Diagnosis Date   Hypertension     Past Surgical History:  Procedure Laterality Date   HERNIA REPAIR     PROSTATE SURGERY     VASECTOMY      Social History   Tobacco Use   Smoking status: Never   Smokeless tobacco: Never   Substance Use Topics   Alcohol use: Yes    Alcohol/week: 0.0 standard drinks    Comment: 8   Drug use: No    Family History  Problem Relation Age of Onset   Diabetes Maternal Grandmother    Cancer Maternal Grandfather    Heart disease Paternal Grandfather     No Known Allergies  Medication list has been reviewed and updated.  Current Outpatient Medications on File Prior to Visit  Medication Sig Dispense Refill   amLODipine (NORVASC) 10 MG tablet TAKE 1 TABLET(10 MG) BY MOUTH DAILY 90 tablet 3   citalopram (CELEXA) 20 MG tablet TAKE 1 TABLET(20 MG) BY MOUTH DAILY 90 tablet 3   hydrochlorothiazide (HYDRODIURIL) 25 MG tablet TAKE 1 TABLET BY MOUTH DAILY 90 tablet 3   methocarbamol (ROBAXIN) 500 MG tablet Take 1 tablet (500 mg total) by mouth every 8 (eight) hours as needed for muscle spasms. (Patient not taking: Reported on 04/19/2020) 30 tablet 1   OVER THE COUNTER MEDICATION Vitamin D 3 5000 mg taking daily     simvastatin (ZOCOR) 10 MG tablet Take 1 tablet (10 mg total) by mouth at bedtime. 90 tablet 3   No current facility-administered medications on file prior to visit.    Review of Systems:  As per HPI- otherwise negative.  Physical Examination: There were no vitals filed for this visit. There were no vitals filed for this visit. There is no height or weight on file  to calculate BMI. Ideal Body Weight:    GEN: no acute distress. HEENT: Atraumatic, Normocephalic.  Ears and Nose: No external deformity. CV: RRR, No M/G/R. No JVD. No thrill. No extra heart sounds. PULM: CTA B, no wheezes, crackles, rhonchi. No retractions. No resp. distress. No accessory muscle use. ABD: S, NT, ND, +BS. No rebound. No HSM. EXTR: No c/c/e PSYCH: Normally interactive. Conversant.    Assessment and Plan: *** Physical exam today.  Encouraged healthy diet and exercise routine Will plan further follow- up pending labs.  Signed Lamar Blinks, MD

## 2021-04-21 ENCOUNTER — Encounter: Payer: BC Managed Care – PPO | Admitting: Family Medicine

## 2021-04-21 DIAGNOSIS — I10 Essential (primary) hypertension: Secondary | ICD-10-CM

## 2021-04-21 DIAGNOSIS — Z0000 Encounter for general adult medical examination without abnormal findings: Secondary | ICD-10-CM

## 2021-04-21 DIAGNOSIS — E785 Hyperlipidemia, unspecified: Secondary | ICD-10-CM

## 2021-04-21 DIAGNOSIS — Z131 Encounter for screening for diabetes mellitus: Secondary | ICD-10-CM

## 2021-04-21 DIAGNOSIS — Z125 Encounter for screening for malignant neoplasm of prostate: Secondary | ICD-10-CM

## 2021-04-21 DIAGNOSIS — Z130 Encounter for screening for diseases of the blood and blood-forming organs and certain disorders involving the immune mechanism: Secondary | ICD-10-CM

## 2021-04-29 DIAGNOSIS — M25562 Pain in left knee: Secondary | ICD-10-CM | POA: Diagnosis not present

## 2021-05-12 ENCOUNTER — Other Ambulatory Visit: Payer: Self-pay | Admitting: Family Medicine

## 2021-05-12 DIAGNOSIS — I10 Essential (primary) hypertension: Secondary | ICD-10-CM

## 2021-05-12 DIAGNOSIS — M25562 Pain in left knee: Secondary | ICD-10-CM | POA: Diagnosis not present

## 2021-05-17 NOTE — Patient Instructions (Addendum)
Good to see you again today, I will be in touch with your labs as soon as possible If not done already, please consider getting the updated COVID-vaccine this fall- need to wait 2 months after your last booster Have a great time in [place] coming up!  Flu shot given today   Continue to work on diet and exercise

## 2021-05-17 NOTE — Progress Notes (Signed)
[place] at [facility] [place], [place], [place] [address] [phone]  Date:  05/18/2021   Name:  Henry Wallace   DOB:  Jun 30, 1961   MRN:  [id number]  PCP:  Darreld Mclean, MD    Chief Complaint: Annual Exam (Concerns/ questions: pt continue to questions testosterone levels/Flu shot today: yes/HIV screen due)   History of Present Illness:  Henry Wallace is a 60 y.o. very pleasant male patient who presents with the following:  Patient seen today for physical exam.  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  COVID-19 new booster; they did a booster just last month in preparation for an upcoming anniversary trip Flu vaccine- will give today  Colonoscopy up-to-date Shingrix is done  Most recent labs 1 year ago He plans to come in for fasting am labs at a later date  Never a smoker, he does exercise some - lifts weights, walking  He is seeing ortho through [facility] for left knee pain  Patient Active Problem List   Diagnosis Date Noted   MDD (major depressive disorder), recurrent, in full remission ([place]) 11/04/2015   OBESITY, UNSPECIFIED 07/06/2009   HYPERTENSION, BENIGN 07/06/2009   CHEST PAIN, PRECORDIAL 07/06/2009    Past Medical History:  Diagnosis Date   Hypertension     Past Surgical History:  Procedure Laterality Date   HERNIA REPAIR     PROSTATE SURGERY     VASECTOMY      Social History   Tobacco Use   Smoking status: Never   Smokeless tobacco: Never  Substance Use Topics   Alcohol use: Yes    Alcohol/week: 0.0 standard drinks    Comment: 8   Drug use: No    Family History  Problem Relation Age of Onset   Diabetes Maternal Grandmother    Cancer Maternal  Grandfather    Heart disease Paternal Grandfather     No Known Allergies  Medication list has been reviewed and updated.  Current Outpatient Medications on File Prior to Visit  Medication Sig Dispense Refill   amLODipine (NORVASC) 10 MG tablet TAKE 1 TABLET(10 MG) BY MOUTH DAILY 90 tablet 3   citalopram (CELEXA) 20 MG tablet TAKE 1 TABLET(20 MG) BY MOUTH DAILY 90 tablet 3   hydrochlorothiazide (HYDRODIURIL) 25 MG tablet TAKE 1 TABLET BY MOUTH DAILY 90 tablet 3   methocarbamol (ROBAXIN) 500 MG tablet Take 1 tablet (500 mg total) by mouth every 8 (eight) hours as needed for muscle spasms. 30 tablet 1   OVER THE COUNTER MEDICATION Vitamin D 3 5000 mg taking daily     No current facility-administered medications on file prior to visit.    Review of Systems:  As per HPI- otherwise negative.   Physical Examination: Vitals:   05/18/21 1312  BP: 130/78  Pulse: 63  Resp: 18  Temp: 98.2 F (36.8 C)  SpO2: 96%   Vitals:   05/18/21 1312  Weight: 242 lb 6.4 oz (110 kg)  Height: 5' 6.14" (1.68 m)   Body mass index is 38.96 kg/m. Ideal Body Weight: Weight in (lb) to have BMI = 25: 155.2  GEN: no acute distress. Overweight. Looks well  HEENT: Atraumatic, Normocephalic.  Bilateral  TM wnl, oropharynx normal.  PEERL,EOMI.   Ears and Nose: No external deformity. CV: RRR, No M/G/R. No JVD. No thrill. No extra heart sounds. PULM: CTA B, no wheezes, crackles, rhonchi. No retractions. No resp. distress. No accessory muscle use. ABD: S, NT, ND, +BS. No rebound. No HSM. EXTR: No c/c/e PSYCH: Normally interactive. Conversant.    Assessment and Plan: Physical exam  Screening for prostate cancer - Plan: PSA  Dyslipidemia - Plan: Lipid panel  Screening for diabetes mellitus - Plan: Comprehensive metabolic panel, Hemoglobin A1c  Essential hypertension - Plan: CBC, Comprehensive metabolic panel, amLODipine (NORVASC) 10 MG tablet  Screening for deficiency anemia - Plan:  CBC  Hypogonadism in male - Plan: Testosterone Total,Free,Bio, Males-([facility])  Major depressive disorder with single episode, in remission ([place]) - Plan: citalopram (CELEXA) 20 MG tablet  Physical exam today.  Encouraged healthy diet and exercise routine Will plan further follow- up pending labs. Flu shot given Doing well with citalopram, refilled  This visit occurred during the SARS-CoV-2 public health emergency.  Safety protocols were in place, including screening questions prior to the visit, additional usage of staff PPE, and extensive cleaning of exam room while observing appropriate contact time as indicated for disinfecting solutions.   Signed Lamar Blinks, MD

## 2021-05-18 ENCOUNTER — Encounter: Payer: Self-pay | Admitting: Family Medicine

## 2021-05-18 ENCOUNTER — Other Ambulatory Visit: Payer: Self-pay

## 2021-05-18 ENCOUNTER — Ambulatory Visit (INDEPENDENT_AMBULATORY_CARE_PROVIDER_SITE_OTHER): Payer: BC Managed Care – PPO | Admitting: Family Medicine

## 2021-05-18 VITALS — BP 130/78 | HR 63 | Temp 98.2°F | Resp 18 | Ht 69.0 in | Wt 242.4 lb

## 2021-05-18 DIAGNOSIS — E291 Testicular hypofunction: Secondary | ICD-10-CM

## 2021-05-18 DIAGNOSIS — Z131 Encounter for screening for diabetes mellitus: Secondary | ICD-10-CM | POA: Diagnosis not present

## 2021-05-18 DIAGNOSIS — Z0000 Encounter for general adult medical examination without abnormal findings: Secondary | ICD-10-CM

## 2021-05-18 DIAGNOSIS — Z130 Encounter for screening for diseases of the blood and blood-forming organs and certain disorders involving the immune mechanism: Secondary | ICD-10-CM

## 2021-05-18 DIAGNOSIS — F325 Major depressive disorder, single episode, in full remission: Secondary | ICD-10-CM

## 2021-05-18 DIAGNOSIS — Z23 Encounter for immunization: Secondary | ICD-10-CM | POA: Diagnosis not present

## 2021-05-18 DIAGNOSIS — R5383 Other fatigue: Secondary | ICD-10-CM

## 2021-05-18 DIAGNOSIS — Z125 Encounter for screening for malignant neoplasm of prostate: Secondary | ICD-10-CM

## 2021-05-18 DIAGNOSIS — I10 Essential (primary) hypertension: Secondary | ICD-10-CM

## 2021-05-18 DIAGNOSIS — E785 Hyperlipidemia, unspecified: Secondary | ICD-10-CM

## 2021-05-18 MED ORDER — AMLODIPINE BESYLATE 10 MG PO TABS: ORAL_TABLET | ORAL | 3 refills | Status: DC

## 2021-05-18 MED ORDER — CITALOPRAM HYDROBROMIDE 20 MG PO TABS: ORAL_TABLET | ORAL | 3 refills | Status: DC

## 2021-05-19 ENCOUNTER — Encounter: Payer: Self-pay | Admitting: Family Medicine

## 2021-05-19 ENCOUNTER — Other Ambulatory Visit (INDEPENDENT_AMBULATORY_CARE_PROVIDER_SITE_OTHER): Payer: BC Managed Care – PPO

## 2021-05-19 DIAGNOSIS — E291 Testicular hypofunction: Secondary | ICD-10-CM

## 2021-05-19 DIAGNOSIS — Z131 Encounter for screening for diabetes mellitus: Secondary | ICD-10-CM | POA: Diagnosis not present

## 2021-05-19 DIAGNOSIS — R5383 Other fatigue: Secondary | ICD-10-CM

## 2021-05-19 DIAGNOSIS — I10 Essential (primary) hypertension: Secondary | ICD-10-CM

## 2021-05-19 DIAGNOSIS — E785 Hyperlipidemia, unspecified: Secondary | ICD-10-CM

## 2021-05-19 DIAGNOSIS — Z125 Encounter for screening for malignant neoplasm of prostate: Secondary | ICD-10-CM

## 2021-05-19 DIAGNOSIS — Z130 Encounter for screening for diseases of the blood and blood-forming organs and certain disorders involving the immune mechanism: Secondary | ICD-10-CM

## 2021-05-19 LAB — CBC
HCT: 44.7 % (ref 39.0–52.0)
Hemoglobin: 15.4 g/dL (ref 13.0–17.0)
MCHC: 34.6 g/dL (ref 30.0–36.0)
MCV: 92.4 fl (ref 78.0–100.0)
Platelets: 229 10*3/uL (ref 150.0–400.0)
RBC: 4.84 Mil/uL (ref 4.22–5.81)
RDW: 12.7 % (ref 11.5–15.5)
WBC: 5.9 10*3/uL (ref 4.0–10.5)

## 2021-05-19 LAB — VITAMIN D 25 HYDROXY (VIT D DEFICIENCY, FRACTURES): VITD: 80.23 ng/mL (ref 30.00–100.00)

## 2021-05-19 LAB — COMPREHENSIVE METABOLIC PANEL
ALT: 58 U/L — ABNORMAL HIGH (ref 0–53)
AST: 46 U/L — ABNORMAL HIGH (ref 0–37)
Albumin: 4.9 g/dL (ref 3.5–5.2)
Alkaline Phosphatase: 72 U/L (ref 39–117)
BUN: 16 mg/dL (ref 6–23)
CO2: 33 mEq/L — ABNORMAL HIGH (ref 19–32)
Calcium: 9.9 mg/dL (ref 8.4–10.5)
Chloride: 99 mEq/L (ref 96–112)
Creatinine, Ser: 1.17 mg/dL (ref 0.40–1.50)
GFR: 67.12 mL/min (ref >60.00)
Glucose, Bld: 118 mg/dL — ABNORMAL HIGH (ref 70–99)
Potassium: 3.7 mEq/L (ref 3.5–5.1)
Sodium: 139 mEq/L (ref 135–145)
Total Bilirubin: 1 mg/dL (ref 0.2–1.2)
Total Protein: 7.1 g/dL (ref 6.0–8.3)

## 2021-05-19 LAB — LIPID PANEL
Cholesterol: 159 mg/dL (ref 0–200)
HDL: 34.6 mg/dL — ABNORMAL LOW (ref >39.00)
LDL Cholesterol: 96 mg/dL (ref 0–99)
NonHDL: 124.89
Total CHOL/HDL Ratio: 5
Triglycerides: 142 mg/dL (ref 0.0–149.0)
VLDL: 28.4 mg/dL (ref 0.0–40.0)

## 2021-05-19 LAB — HEMOGLOBIN A1C: Hgb A1c MFr Bld: 5.6 % (ref 4.6–6.5)

## 2021-05-19 LAB — PSA: PSA: 1.55 ng/mL (ref 0.10–4.00)

## 2021-05-20 ENCOUNTER — Encounter: Payer: Self-pay | Admitting: Family Medicine

## 2021-05-20 LAB — TESTOSTERONE TOTAL,FREE,BIO, MALES
Albumin: 4.8 g/dL (ref 3.6–5.1)
Sex Hormone Binding: 23 nmol/L (ref 22–77)
Testosterone, Bioavailable: 131.8 ng/dL (ref 110.0–575.0)
Testosterone, Free: 60.2 pg/mL (ref 46.0–224.0)
Testosterone: 361 ng/dL (ref 250–827)

## 2021-05-27 DIAGNOSIS — M25562 Pain in left knee: Secondary | ICD-10-CM | POA: Diagnosis not present

## 2021-09-07 ENCOUNTER — Other Ambulatory Visit: Payer: Self-pay

## 2021-09-07 ENCOUNTER — Ambulatory Visit (HOSPITAL_COMMUNITY)
Admission: EM | Admit: 2021-09-07 | Discharge: 2021-09-07 | Disposition: A | Discharge status: Home / Self Care | Payer: BC Managed Care – PPO

## 2021-09-07 ENCOUNTER — Encounter (HOSPITAL_COMMUNITY): Payer: Self-pay | Admitting: Emergency Medicine

## 2021-09-07 DIAGNOSIS — S61213A Laceration without foreign body of left middle finger without damage to nail, initial encounter: Secondary | ICD-10-CM | POA: Diagnosis not present

## 2021-09-07 MED ORDER — LIDOCAINE-EPINEPHRINE 1 %-1:100000 IJ SOLN
INTRAMUSCULAR | Status: AC
  Filled 2021-09-07: qty 1

## 2021-09-07 MED ORDER — CEPHALEXIN 500 MG PO CAPS: 500.0000 mg | ORAL_CAPSULE | Freq: Four times a day (QID) | ORAL | 0 refills | Status: AC

## 2021-09-07 NOTE — ED Triage Notes (Signed)
PT was cutting cheese and knife slipped. Laceration to left middle finger.

## 2021-09-07 NOTE — ED Provider Notes (Signed)
Lynn    CSN: [id number] Arrival date & time: 09/07/21  1918    HISTORY   Chief Complaint  Patient presents with   Finger Injury   HPI Henry Wallace is a 61 y.o. male. Patient complains of a laceration on his left middle finger, states he was cutting cheese and the knife he was using slipped and sliced the tip of his finger.  Patient states this just happened within the hour, patient states it is diluting at this time.  The history is provided by the patient.  Past Medical History:  Diagnosis Date   Hypertension    Patient Active Problem List   Diagnosis Date Noted   MDD (major depressive disorder), recurrent, in full remission ([place]) 11/04/2015   OBESITY, UNSPECIFIED 07/06/2009   HYPERTENSION, BENIGN 07/06/2009   CHEST PAIN, PRECORDIAL 07/06/2009   Past Surgical History:  Procedure Laterality Date   HERNIA REPAIR     PROSTATE SURGERY     VASECTOMY      Home Medications    Prior to Admission medications   Medication Sig Start Date End Date Taking? Authorizing Provider  amLODipine (NORVASC) 10 MG tablet TAKE 1 TABLET(10 MG) BY MOUTH DAILY 05/18/21   Copland, Gay Filler, MD  citalopram (CELEXA) 20 MG tablet TAKE 1 TABLET(20 MG) BY MOUTH DAILY 05/18/21   Copland, Gay Filler, MD  hydrochlorothiazide (HYDRODIURIL) 25 MG tablet TAKE 1 TABLET BY MOUTH DAILY 05/12/21   Copland, Gay Filler, MD  methocarbamol (ROBAXIN) 500 MG tablet Take 1 tablet (500 mg total) by mouth every 8 (eight) hours as needed for muscle spasms. 04/17/19   Copland, Gay Filler, MD  OVER THE COUNTER MEDICATION Vitamin D 3 5000 mg taking daily    [provider]    Family History Family History  Problem Relation Age of Onset   Diabetes Maternal Grandmother    Cancer Maternal Grandfather    Heart disease Paternal Grandfather    Social History Social History   Tobacco Use   Smoking status: Never   Smokeless tobacco: Never  Substance Use Topics   Alcohol use: Yes     Alcohol/week: 0.0 standard drinks    Comment: 8   Drug use: No   Allergies   Patient has no known allergies.  Review of Systems Review of Systems Pertinent findings noted in history of present illness.   Physical Exam Triage Vital Signs ED Triage Vitals  Enc Vitals Group     BP 06/24/21 0827 (!) 147/82     Pulse Rate 06/24/21 0827 72     Resp 06/24/21 0827 18     Temp 06/24/21 0827 98.3 F (36.8 C)     Temp Source 06/24/21 0827 Oral     SpO2 06/24/21 0827 98 %     Weight --      Height --      Head Circumference --      Peak Flow --      Pain Score 06/24/21 0826 5     Pain Loc --      Pain Edu? --      Excl. in [place]? --   No data found.  Updated Vital Signs BP (!) 151/84    Pulse 64    Temp 98.4 F (36.9 C) (Oral)    Resp 16    SpO2 98%   Physical Exam Vitals and nursing note reviewed.  Constitutional:      General: He is not in acute distress.  Appearance: Normal appearance. He is not ill-appearing.  HENT:     Head: Normocephalic and atraumatic.  Eyes:     General: Lids are normal.        Right eye: No discharge.        Left eye: No discharge.     Extraocular Movements: Extraocular movements intact.     Conjunctiva/sclera: Conjunctivae normal.     Right eye: Right conjunctiva is not injected.     Left eye: Left conjunctiva is not injected.  Neck:     Trachea: Trachea and phonation normal.  Cardiovascular:     Rate and Rhythm: Normal rate and regular rhythm.     Pulses: Normal pulses.     Heart sounds: Normal heart sounds. No murmur heard.   No friction rub. No gallop.  Pulmonary:     Effort: Pulmonary effort is normal. No accessory muscle usage, prolonged expiration or respiratory distress.     Breath sounds: Normal breath sounds. No stridor, decreased air movement or transmitted upper airway sounds. No decreased breath sounds, wheezing, rhonchi or rales.  Chest:     Chest wall: No tenderness.  Musculoskeletal:        General: Normal range of motion.      Cervical back: Normal range of motion and neck supple. Normal range of motion.  Lymphadenopathy:     Cervical: No cervical adenopathy.  Skin:    General: Skin is warm and dry.     Findings: No erythema or rash.     Comments: 1 cm laceration on anterior aspect of distal left middle finger.  Neurological:     General: No focal deficit present.     Mental Status: He is alert and oriented to person, place, and time.  Psychiatric:        Mood and Affect: Mood normal.        Behavior: Behavior normal.    Visual Acuity Right Eye Distance:   Left Eye Distance:   Bilateral Distance:    Right Eye Near:   Left Eye Near:    Bilateral Near:     UC Couse / Diagnostics / Procedures:    EKG  Radiology No results found.  Procedures Laceration Repair  Date/Time: 09/07/2021 8:14 PM Performed by: Lynden Oxford Scales, PA-C Authorized by: Lynden Oxford Scales, PA-C   Consent:    Consent obtained:  Verbal   Consent given by:  Patient   Risks, benefits, and alternatives were discussed: yes     Risks discussed:  Infection, pain, need for additional repair, poor cosmetic result, poor wound healing, nerve damage, vascular damage and tendon damage   Alternatives discussed:  No treatment, delayed treatment, observation and referral Universal protocol:    Procedure explained and questions answered to patient or proxy's satisfaction: yes     Patient identity confirmed:  Verbally with patient Anesthesia:    Anesthesia method:  Local infiltration   Local anesthetic:  Lidocaine 1% WITH epi Laceration details:    Location:  Finger   Finger location:  L long finger   Length (cm):  1   Depth (mm):  2 Pre-procedure details:    Preparation:  Patient was prepped and draped in usual sterile fashion Exploration:    Limited defect created (wound extended): no     Hemostasis achieved with:  Epinephrine   Imaging outcome: foreign body not noted     Wound exploration: wound explored through  full range of motion and entire depth of wound visualized  Wound extent: no fascia violation noted, no foreign bodies/material noted, no muscle damage noted, no nerve damage noted, no tendon damage noted, no underlying fracture noted and no vascular damage noted     Contaminated: no   Treatment:    Area cleansed with:  Chlorhexidine   Amount of cleaning:  Standard   Irrigation solution:  Tap water   Irrigation volume:  Kidney bowl soak   Irrigation method:  Tap   Debridement:  None   Undermining:  None   Scar revision: no   Skin repair:    Repair method:  Sutures   Suture size:  4-0   Suture material:  Prolene   Suture technique:  Simple interrupted   Number of sutures:  3 Approximation:    Approximation:  Close Repair type:    Repair type:  Simple Post-procedure details:    Dressing:  Bulky dressing   Procedure completion:  Tolerated (including critical care time)  UC Diagnoses / Final Clinical Impressions(s)   I have reviewed the triage vital signs and the nursing notes.  Pertinent labs & imaging results that were available during my care of the patient were reviewed by me and considered in my medical decision making (see chart for details).    Final diagnoses:  Laceration of left middle finger without foreign body without damage to nail, initial encounter   Wound was closed without incident, patient tolerated well.  Keflex prescribed, wound care instructions and return precautions provided. Pt advised to have sutures removed in 10 days.   ED Prescriptions     Medication Sig Dispense Auth. Provider   cephALEXin (KEFLEX) 500 MG capsule Take 1 capsule (500 mg total) by mouth 4 (four) times daily for 5 days. 20 capsule Lynden Oxford Scales, PA-C      PDMP not reviewed this encounter.  Pending results:  Labs Reviewed - No data to display  Medications Ordered in UC: Medications - No data to display  Disposition Upon Discharge:  Condition: stable for discharge  home Home: take medications as prescribed; routine discharge instructions as discussed; follow up as advised.  Patient presented with an acute illness with associated systemic symptoms and significant discomfort requiring urgent management. In my opinion, this is a condition that a prudent lay person (someone who possesses an average knowledge of health and medicine) may potentially expect to result in complications if not addressed urgently such as respiratory distress, impairment of bodily function or dysfunction of bodily organs.   Routine symptom specific, illness specific and/or disease specific instructions were discussed with the patient and/or caregiver at length.   As such, the patient has been evaluated and assessed, work-up was performed and treatment was provided in alignment with urgent care protocols and evidence based medicine.  Patient/parent/caregiver has been advised that the patient may require follow up for further testing and treatment if the symptoms continue in spite of treatment, as clinically indicated and appropriate.  If the patient was tested for COVID-19, Influenza and/or RSV, then the patient/parent/guardian was advised to isolate at home pending the results of his/her diagnostic coronavirus test and potentially longer if theyre positive. I have also advised pt that if his/her COVID-19 test returns positive, it's recommended to self-isolate for at least 10 days after symptoms first appeared AND until fever-free for 24 hours without fever reducer AND other symptoms have improved or resolved. Discussed self-isolation recommendations as well as instructions for household member/close contacts as per the CDC and [place] DHHS, and also gave patient the [place]  packet with this information.  Patient/parent/caregiver has been advised to return to the [facility] or PCP in 3-5 days if no better; to PCP or the Emergency Department if new signs and symptoms develop, or if the current signs or  symptoms continue to change or worsen for further workup, evaluation and treatment as clinically indicated and appropriate  The patient will follow up with their current PCP if and as advised. If the patient does not currently have a PCP we will assist them in obtaining one.   The patient may need specialty follow up if the symptoms continue, in spite of conservative treatment and management, for further workup, evaluation, consultation and treatment as clinically indicated and appropriate.   Patient/parent/caregiver verbalized understanding and agreement of plan as discussed.  All questions were addressed during visit.  Please see discharge instructions below for further details of plan.  Discharge Instructions:   Discharge Instructions      Please keep pressure bandage on your finger until the bleeding has completely stopped, I recommend that you check it after 24 hours.  Please begin Keflex, 1 capsule 4 times daily for infection prevention  If you plan to do any activity where you feel that your finger may become soiled, recommend that you rewrap or wear nitrile gloves (provided).  It is important that the stitches removed in 10 days, this can be done by any provider either at urgent care or by your primary care provider.  Please monitor your wound for increased redness, increased pain, increased swelling or purulent drainage, this would indicate significant infection in the wound which needs to be addressed this is possible.  Thank you for visiting urgent care today.      This office note has been dictated using Museum/gallery curator.  Unfortunately, and despite my best efforts, this method of dictation can sometimes lead to occasional typographical or grammatical errors.  I apologize in advance if this occurs.     Lynden Oxford Scales, PA-C 09/07/21 2018

## 2021-09-07 NOTE — Discharge Instructions (Addendum)
Please keep pressure bandage on your finger until the bleeding has completely stopped, I recommend that you check it after 24 hours.  Please begin Keflex, 1 capsule 4 times daily for infection prevention  If you plan to do any activity where you feel that your finger may become soiled, recommend that you rewrap or wear nitrile gloves (provided).  It is important that the stitches removed in 10 days, this can be done by any provider either at urgent care or by your primary care provider.  Please monitor your wound for increased redness, increased pain, increased swelling or purulent drainage, this would indicate significant infection in the wound which needs to be addressed this is possible.  Thank you for visiting urgent care today.

## 2021-10-13 ENCOUNTER — Emergency Department (HOSPITAL_COMMUNITY)
Admission: EM | Admit: 2021-10-13 | Discharge: 2021-10-14 | Disposition: A | Discharge status: Home / Self Care | Payer: BC Managed Care – PPO | Attending: Emergency Medicine | Admitting: Emergency Medicine

## 2021-10-13 DIAGNOSIS — Z5321 Procedure and treatment not carried out due to patient leaving prior to being seen by health care provider: Secondary | ICD-10-CM | POA: Insufficient documentation

## 2021-10-13 DIAGNOSIS — R0981 Nasal congestion: Secondary | ICD-10-CM | POA: Diagnosis not present

## 2021-10-13 DIAGNOSIS — I10 Essential (primary) hypertension: Secondary | ICD-10-CM | POA: Diagnosis not present

## 2021-10-13 DIAGNOSIS — H5461 Unqualified visual loss, right eye, normal vision left eye: Secondary | ICD-10-CM | POA: Diagnosis not present

## 2021-10-13 DIAGNOSIS — H53451 Other localized visual field defect, right eye: Secondary | ICD-10-CM | POA: Insufficient documentation

## 2021-10-13 NOTE — ED Notes (Signed)
CBG 147 

## 2021-10-13 NOTE — ED Triage Notes (Signed)
Pt c/o R visual field changes approx 1530, mid-hemisphere, R side "like looking through water, light coming through L side." Advised he put drops in his eye, approx 50min after, resolved. 2245, sensation returned. Telehealth appt, advised to come to ED to rule out stroke.  "head cold" x4 days. Has not had tonights BP meds

## 2021-10-14 ENCOUNTER — Emergency Department (HOSPITAL_COMMUNITY): Payer: BC Managed Care – PPO

## 2021-10-14 ENCOUNTER — Encounter: Payer: Self-pay | Admitting: Family Medicine

## 2021-10-14 ENCOUNTER — Telehealth: Payer: Self-pay

## 2021-10-14 DIAGNOSIS — H5461 Unqualified visual loss, right eye, normal vision left eye: Secondary | ICD-10-CM | POA: Diagnosis not present

## 2021-10-14 DIAGNOSIS — G43509 Persistent migraine aura without cerebral infarction, not intractable, without status migrainosus: Secondary | ICD-10-CM | POA: Diagnosis not present

## 2021-10-14 DIAGNOSIS — H5319 Other subjective visual disturbances: Secondary | ICD-10-CM | POA: Diagnosis not present

## 2021-10-14 LAB — COMPREHENSIVE METABOLIC PANEL
ALT: 36 U/L (ref 0–44)
AST: 28 U/L (ref 15–41)
Albumin: 4.4 g/dL (ref 3.5–5.0)
Alkaline Phosphatase: 85 U/L (ref 38–126)
Anion gap: 10 (ref 5–15)
BUN: 17 mg/dL (ref 8–23)
CO2: 27 mmol/L (ref 22–32)
Calcium: 9.4 mg/dL (ref 8.9–10.3)
Chloride: 100 mmol/L (ref 98–111)
Creatinine, Ser: 1.1 mg/dL (ref 0.61–1.24)
GFR, Estimated: >60 mL/min (ref >60)
Glucose, Bld: 126 mg/dL — ABNORMAL HIGH (ref 70–99)
Potassium: 3.5 mmol/L (ref 3.5–5.1)
Sodium: 137 mmol/L (ref 135–145)
Total Bilirubin: 0.6 mg/dL (ref 0.3–1.2)
Total Protein: 6.8 g/dL (ref 6.5–8.1)

## 2021-10-14 LAB — I-STAT CHEM 8, ED
BUN: 20 mg/dL (ref 8–23)
Calcium, Ion: 1.17 mmol/L (ref 1.15–1.40)
Chloride: 98 mmol/L (ref 98–111)
Creatinine, Ser: 1.1 mg/dL (ref 0.61–1.24)
Glucose, Bld: 115 mg/dL — ABNORMAL HIGH (ref 70–99)
HCT: 43 % (ref 39.0–52.0)
Hemoglobin: 14.6 g/dL (ref 13.0–17.0)
Potassium: 3.5 mmol/L (ref 3.5–5.1)
Sodium: 139 mmol/L (ref 135–145)
TCO2: 31 mmol/L (ref 22–32)

## 2021-10-14 LAB — DIFFERENTIAL
Abs Immature Granulocytes: 0.04 10*3/uL (ref 0.00–0.07)
Basophils Absolute: 0 10*3/uL (ref 0.0–0.1)
Basophils Relative: 0 %
Eosinophils Absolute: 0.5 10*3/uL (ref 0.0–0.5)
Eosinophils Relative: 6 %
Immature Granulocytes: 1 %
Lymphocytes Relative: 36 %
Lymphs Abs: 2.8 10*3/uL (ref 0.7–4.0)
Monocytes Absolute: 0.8 10*3/uL (ref 0.1–1.0)
Monocytes Relative: 10 %
Neutro Abs: 3.7 10*3/uL (ref 1.7–7.7)
Neutrophils Relative %: 47 %

## 2021-10-14 LAB — RAPID URINE DRUG SCREEN, HOSP PERFORMED
Amphetamines: NOT DETECTED
Barbiturates: NOT DETECTED
Benzodiazepines: NOT DETECTED
Cocaine: NOT DETECTED
Opiates: NOT DETECTED
Tetrahydrocannabinol: NOT DETECTED

## 2021-10-14 LAB — CBC
HCT: 43.4 % (ref 39.0–52.0)
Hemoglobin: 15.3 g/dL (ref 13.0–17.0)
MCH: 31.9 pg (ref 26.0–34.0)
MCHC: 35.3 g/dL (ref 30.0–36.0)
MCV: 90.6 fL (ref 80.0–100.0)
Platelets: 230 10*3/uL (ref 150–400)
RBC: 4.79 MIL/uL (ref 4.22–5.81)
RDW: 12.5 % (ref 11.5–15.5)
WBC: 7.8 10*3/uL (ref 4.0–10.5)
nRBC: 0 % (ref 0.0–0.2)

## 2021-10-14 LAB — URINALYSIS, ROUTINE W REFLEX MICROSCOPIC
Bilirubin Urine: NEGATIVE
Glucose, UA: NEGATIVE mg/dL
Hgb urine dipstick: NEGATIVE
Ketones, ur: NEGATIVE mg/dL
Leukocytes,Ua: NEGATIVE
Nitrite: NEGATIVE
Protein, ur: NEGATIVE mg/dL
Specific Gravity, Urine: 1.025 (ref 1.005–1.030)
pH: 6 (ref 5.0–8.0)

## 2021-10-14 LAB — CBG MONITORING, ED: Glucose-Capillary: 147 mg/dL — ABNORMAL HIGH (ref 70–99)

## 2021-10-14 LAB — APTT: aPTT: 34 seconds (ref 24–36)

## 2021-10-14 LAB — PROTIME-INR
INR: 1 (ref 0.8–1.2)
Prothrombin Time: 13.1 seconds (ref 11.4–15.2)

## 2021-10-14 LAB — ETHANOL: Alcohol, Ethyl (B): <10 mg/dL (ref <10)

## 2021-10-14 NOTE — Telephone Encounter (Signed)
Nurse Assessment Nurse: Adrian Blackwater, RN, Claiborne Billings Date/Time Eilene Ghazi Time): 10/14/2021 12:12:31 PM Confirm and document reason for call. If symptomatic, describe symptoms. ---Caller states her husband began to have a loss of vision in his right eye, with a line going down the center of his vision. The right side of the line is blurry and he sees a bright light down the seam or line. They called [facility] and were advised to go to ER, which they did. Pt had some neuro tests and some blood drawn, and had a CT, then they were sent to the waiting room for 4 hours, so they left. Does the patient have any new or worsening symptoms? ---Yes Will a triage be completed? ---Yes Related visit to physician within the last 2 weeks? ---Yes Does the PT have any chronic conditions? (i.e. diabetes, asthma, this includes High risk factors for pregnancy, etc.) ---Yes List chronic conditions. ---HTN Is this a behavioral health or substance abuse call? ---No PLEASE NOTE: All timestamps contained within this report are represented as [place] Standard Time. CONFIDENTIALTY NOTICE: This fax transmission is intended only for the addressee. It contains information that is legally privileged, confidential or otherwise protected from use or disclosure. If you are not the intended recipient, you are strictly prohibited from reviewing, disclosing, copying using or disseminating any of this information or taking any action in reliance on or regarding this information. If you have received this fax in error, please notify us immediately by telephone so that we can arrange for its return to [place]. Phone: [phone], Toll-Free: [phone], Fax: [phone] Page: 2 of 2 Call Id: [id number] Guidelines Guideline Title Affirmed Question Affirmed Notes Nurse Date/Time Eilene Ghazi Time) Vision Loss or Change [1] Blurred vision or visual changes AND [2] present now AND [3] sudden onset or new (e.g., minutes, hours, days)  (Exception: seeing floaters / black specks OR previously diagnosed migraine headaches with same symptoms) Gigi Gin 10/14/2021 12:17:32 PM Disp. Time Eilene Ghazi Time) Disposition Final User 10/14/2021 12:10:20 PM Send to Urgent [place] 10/14/2021 12:19:14 PM Go to ED Now (or PCP triage) Yes Adrian Blackwater, RN, Maxie Barb Disagree/Comply Comply Caller Understands Yes PreDisposition Call Doctor Care Advice Given Per Guideline GO TO ED NOW (OR PCP TRIAGE): * IF NO PCP (PRIMARY CARE PROVIDER) SECOND-LEVEL TRIAGE: You need to be seen within the next hour. Go to the [facility] at _____________ [place] as soon as you can. ANOTHER ADULT SHOULD DRIVE: * It is better and safer if another adult drives instead of you. CARE ADVICE given per Vision Loss or Change (Adult) guideline. Referrals [facility] - ED

## 2021-10-14 NOTE — ED Provider Triage Note (Signed)
Emergency Medicine Provider Triage Evaluation Note  Henry UDDIN , a 61 y.o. male  was evaluated in triage.  Pt complains of visual field defect in the R eye. Symptoms first occurred at 1530 where patient experienced a "squiggly" vertical line in his R eye vision where the periphery was blurry "like looking through water". Applied eye drops and had spontaneous symptom resolution in ~15 minutes. Symptoms recurred again tonight at 2245 and have since, also, resolved. Has been having sinus pressure associated with a "head cold", but no headache. Denies fever, complete vision loss, facial drooping, paresthesias, extremity weakness.  Review of Systems  Positive: As above Negative: As above  Physical Exam  BP (!) 160/99 (BP Location: Left Arm)    Pulse 63    Temp 97.9 F (36.6 C) (Oral)    Resp 18    SpO2 96%  Gen:   Awake, no distress   Resp:  Normal effort  MSK:   Moves extremities without difficulty  Other:  GCS 15. Speech is goal oriented. No cranial nerve deficits appreciated; symmetric eyebrow raise, no facial drooping, tongue midline. Patient has equal grip strength bilaterally with 5/5 strength against resistance in all major muscle groups bilaterally. Sensation to light touch intact. Patient moves extremities without ataxia. Patient ambulatory with steady gait.  Medical Decision Making  Medically screening exam initiated at 12:13 AM.  Appropriate orders placed.  PHILLIP MAFFEI was informed that the remainder of the evaluation will be completed by another provider, this initial triage assessment does not replace that evaluation, and the importance of remaining in the ED until their evaluation is complete.  Visual field defect - will obtain labs and CT to assess for CVA. May necessitate bedside ocular [place] to assess for retinal issue.   Antonietta Breach, PA-C 10/14/21 0174

## 2021-10-14 NOTE — Telephone Encounter (Signed)
See below- pt left ED after 4 hr wait.

## 2021-10-14 NOTE — ED Notes (Signed)
Pt stated the wait was longer than expected. Pt was seen handing in his labels and walking out of the ED with family.

## 2021-10-15 NOTE — Telephone Encounter (Signed)
Pts wife [name] has been in touch with me as they cannot get into his mychart   Thank you for your very kind response. We visited our optometrist today and he carefully examined Abdulla's affected eye. 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 He said it was not uncommon. He has had no further symptoms today. Do you feel that an MRI is necessary if he continues to be asymptomatic?  I hope you have a safe and happy trip.   [name]    You  Tessie Eke 19 hours ago (12:24 PM)   Hi [name]- I am so sorry this happened to you guys!  I just looked at [facility] CT and it is normal- however an MRI may be indicated to truly rule out a stroke.  Does he still have the symptoms?   If you have an eye doctor it would also make sense to call them and see if they can evaluate Kyreese urgently to make sure not an eye (as opposed to a brain) issue.  Of course it may have been something as innocent as a migraine headache- however especially if his symptoms continue an urgent MRI may be needed.  I would recommend that you consider going back to the ER- as terrible as that may sound- to complete evaluation.     I am actually on an airplane on the way to [place] right now- I am sorry I am not able to be more helpful right this moment!  You can also certainly call my office and see if one of my partners can see him today and make a recommendation.  Without having more details and seeing him myself I hesitate to give any more specific advice, as an unexplained visual change can indicate something serious as you know   Please let me know what else I can to to help and I am sorry I am not there to offer further assistance   Joelyn Oms, Vilma Prader, CMA routed conversation to You 23 hours ago (8:55 AM)   Tessie Eke  P [facility] (supporting Seve Monette, Gay Filler, MD) Yesterday (4:15 AM)   Dr. Lorelei Pont,   Oletta Lamas had a significant visual  disturbance in his right eye. The telecom doctor requested that he go to the ER for evaluation. I got him to go and they did bloodwork and a CT. We waited until 4am Friday with no one telling us anything. We finally had to come home to keep from passing out from fatigue in the waiting area. We are guessing it is not a stroke but it is something and wed know it bears further scrutiny. He cant remember his my chart access so I am writing to you on his behalf. Please reach out to [place] at your earliest opportunity.   I replied to her again- advised that the most conservative plan would be to get a brain MRI and carotid US.  I am also glad to see him next week if they like

## 2021-10-16 ENCOUNTER — Other Ambulatory Visit: Payer: Self-pay | Admitting: Family Medicine

## 2021-10-16 DIAGNOSIS — H539 Unspecified visual disturbance: Secondary | ICD-10-CM

## 2021-10-17 ENCOUNTER — Other Ambulatory Visit: Payer: Self-pay | Admitting: Family Medicine

## 2021-10-17 ENCOUNTER — Encounter: Payer: Self-pay | Admitting: Family Medicine

## 2021-10-17 ENCOUNTER — Ambulatory Visit (HOSPITAL_COMMUNITY)
Admission: RE | Admit: 2021-10-17 | Discharge: 2021-10-17 | Disposition: A | Discharge status: Home / Self Care | Payer: BC Managed Care – PPO | Source: Ambulatory Visit | Attending: Family Medicine | Admitting: Family Medicine

## 2021-10-17 DIAGNOSIS — H539 Unspecified visual disturbance: Secondary | ICD-10-CM

## 2021-10-17 DIAGNOSIS — R2990 Unspecified symptoms and signs involving the nervous system: Secondary | ICD-10-CM

## 2021-10-17 DIAGNOSIS — H538 Other visual disturbances: Secondary | ICD-10-CM | POA: Diagnosis not present

## 2021-10-18 ENCOUNTER — Ambulatory Visit (HOSPITAL_COMMUNITY)
Admission: RE | Admit: 2021-10-18 | Discharge: 2021-10-18 | Disposition: A | Discharge status: Home / Self Care | Payer: BC Managed Care – PPO | Source: Ambulatory Visit | Attending: Family Medicine | Admitting: Family Medicine

## 2021-10-18 ENCOUNTER — Encounter: Payer: Self-pay | Admitting: Family Medicine

## 2021-10-18 ENCOUNTER — Other Ambulatory Visit: Payer: Self-pay

## 2021-10-18 DIAGNOSIS — J0110 Acute frontal sinusitis, unspecified: Secondary | ICD-10-CM

## 2021-10-18 DIAGNOSIS — I6523 Occlusion and stenosis of bilateral carotid arteries: Secondary | ICD-10-CM | POA: Insufficient documentation

## 2021-10-18 DIAGNOSIS — R2990 Unspecified symptoms and signs involving the nervous system: Secondary | ICD-10-CM | POA: Insufficient documentation

## 2021-10-18 DIAGNOSIS — H539 Unspecified visual disturbance: Secondary | ICD-10-CM | POA: Diagnosis not present

## 2021-10-18 MED ORDER — AMOXICILLIN 500 MG PO CAPS: 1000.0000 mg | ORAL_CAPSULE | Freq: Two times a day (BID) | ORAL | 0 refills | Status: DC

## 2021-10-21 NOTE — Progress Notes (Signed)
[place] at [facility] [address] [place], [place] [address] [phone]  Date:  10/24/2021   Name:  Henry Wallace   DOB:  [birth year]   MRN:  [id number]  PCP:  Darreld Mclean, MD    Chief Complaint: MRI results (CT on 10/14/21, MRI on 10/17/21. Started Amoxicillin . Needs to discuss starting Statin since stenosis was noted on imaging. /Concerns/ questions: the eye thing happened again on Saturday morning)   History of Present Illness:  Henry Wallace is a 61 y.o. very pleasant male patient who presents with the following:  Pt following up from recent episode of visual change Last visit with myself was a CPE in 9/22- History of hypertension and dyslipidemia Pt contacted me on 2/17- he was seen in the ER but left due to wait Pt notes he was relaxing on the couch- he had been sick with upper respiratory symptoms for about 3 days at the time  He noted a visual defect like a hair in his RIGHT eye- but there was not a hair there after all.  He notes it seemed like there was a line in his vision The LEFT eye was normal He tried putting some eye drops in but no change His BP was ok at home  This lasted about 15 minutes - however occurred again about 5-6 hours later.  Similar episode, lasted 15 minutes. They called for advice and were told to come to the ER.  They did go and had an EKG, CT and BW- however left prior to eval due to length of wait   He did see his eye doctor the next day- had a dilated eye exam and all was ok They thought perhaps an ophthalmic migraine   He had another similar episode this past Saturday- 2 days ago It lasted about 5 minutes and then resolved  His vision is back to normal He notes sinus pressure and sinus HA but otherwise  No other neuro abnl  We got an MRI head and carotid US  COMPARISON:  CT head 10/14/2021.  No prior MRI FINDINGS: Brain: No restricted diffusion to suggest acute or subacute  infarct. No acute hemorrhage, mass, mass effect, or midline shift. No hydrocephalus or extra-axial collection. Dilated perivascular spaces. Vascular: Normal flow voids. Skull and upper cervical spine: Normal marrow signal. Sinuses/Orbits: Significant mucosal thickening throughout the ethmoid air cells, with less significant maxillary thickening in the maxillary, sphenoid, and frontal sinuses. The orbits are unremarkable. Other: The mastoids are well aerated. IMPRESSION: No acute intracranial process.  Carotid US Summary:  Right Carotid: Velocities in the right ICA are consistent with a 1-39%  stenosis. Left Carotid: Velocities in the left ICA are consistent with a 1-39%  stenosis. Vertebrals:  Bilateral vertebral arteries demonstrate antegrade flow.  Subclavians: Normal flow hemodynamics were seen in bilateral subclavian               arteries.   We are also treating him for a sinus infection right now with amox 100 BID He feels like he is still really congested and having sinus discomfort.  He would ike to try a course of pred if possible   Pt's wife is a pt of Dr Leonie Man - they would like to see him as far as neurology   Patient Active Problem List   Diagnosis Date Noted   Carotid stenosis, asymptomatic, bilateral 10/18/2021   MDD (major depressive disorder), recurrent, in  full remission ([place]) 11/04/2015   OBESITY, UNSPECIFIED 07/06/2009   HYPERTENSION, BENIGN 07/06/2009   CHEST PAIN, PRECORDIAL 07/06/2009    Past Medical History:  Diagnosis Date   Hypertension     Past Surgical History:  Procedure Laterality Date   HERNIA REPAIR     PROSTATE SURGERY     VASECTOMY      Social History   Tobacco Use   Smoking status: Never   Smokeless tobacco: Never  Substance Use Topics   Alcohol use: Yes    Alcohol/week: 0.0 standard drinks    Comment: 8   Drug use: No    Family History  Problem Relation Age of Onset   Diabetes Maternal Grandmother    Cancer Maternal  Grandfather    Heart disease Paternal Grandfather     No Known Allergies  Medication list has been reviewed and updated.  Current Outpatient Medications on File Prior to Visit  Medication Sig Dispense Refill   5-Hydroxytryptophan (5-HTP) 100 MG CAPS Take by mouth.     amLODipine (NORVASC) 10 MG tablet TAKE 1 TABLET(10 MG) BY MOUTH DAILY 90 tablet 3   amoxicillin (AMOXIL) 500 MG capsule Take 2 capsules (1,000 mg total) by mouth 2 (two) times daily. 40 capsule 0   aspirin 81 MG chewable tablet Chew by mouth daily.     Cholecalciferol (VITAMIN D3) 75 MCG (3000 UT) TABS Take by mouth.     citalopram (CELEXA) 20 MG tablet TAKE 1 TABLET(20 MG) BY MOUTH DAILY 90 tablet 3   hydrochlorothiazide (HYDRODIURIL) 25 MG tablet TAKE 1 TABLET BY MOUTH DAILY 90 tablet 3   multivitamin-iron-minerals-folic acid (CENTRUM) chewable tablet Chew 1 tablet by mouth daily.     OVER THE COUNTER MEDICATION Vitamin D 3 5000 mg taking daily     No current facility-administered medications on file prior to visit.    Review of Systems:  As per HPI- otherwise negative.   Physical Examination: Vitals:   10/24/21 0953  BP: 120/80  Pulse: 79  Resp: 18  Temp: 97.8 F (36.6 C)  SpO2: 95%   Vitals:   10/24/21 0953  Weight: 233 lb 12.8 oz (106.1 kg)  Height: 5\' 9"  (1.753 m)   Body mass index is 34.53 kg/m. Ideal Body Weight: Weight in (lb) to have BMI = 25: 168.9  GEN: no acute distress.    Obese, looks well  HEENT: Atraumatic, Normocephalic.  Bilateral TM wnl, oropharynx normal.  PEERL,EOMI.   Ears and Nose: No external deformity. CV: RRR, No M/G/R. No JVD. No thrill. No extra heart sounds. PULM: CTA B, no wheezes, crackles, rhonchi. No retractions. No resp. distress. No accessory muscle use. EXTR: No c/c/e PSYCH: Normally interactive. Conversant.  Normal strength, sensation of all limbs and face  Subdued but symmetrical DTR of all limbs Normal romberg and tandem stance  Assessment and  Plan: Vision changes - Plan: predniSONE (DELTASONE) 20 MG tablet, Ambulatory referral to Neurology, CANCELED: Ambulatory referral to Neurology  Neurosensory deficit - Plan: predniSONE (DELTASONE) 20 MG tablet, Ambulatory referral to Neurology, CANCELED: Ambulatory referral to Neurology  Bilateral carotid artery stenosis - Plan: rosuvastatin (CRESTOR) 5 MG tablet  Pt seen today with concern of vision change as detailed above- likely migraine but cannot rule out another neurological event.  MRI and CT head reassuring except for sinusitis Will add prednisone Referral made to Dr Leonie Man With some trepidation pt agrees to try crestor- suggested he try taking QOD at first     Leslie, MD

## 2021-10-24 ENCOUNTER — Ambulatory Visit (INDEPENDENT_AMBULATORY_CARE_PROVIDER_SITE_OTHER): Payer: BC Managed Care – PPO | Admitting: Family Medicine

## 2021-10-24 VITALS — BP 120/80 | HR 79 | Temp 97.8°F | Resp 18 | Ht 69.0 in | Wt 233.8 lb

## 2021-10-24 DIAGNOSIS — I6523 Occlusion and stenosis of bilateral carotid arteries: Secondary | ICD-10-CM | POA: Diagnosis not present

## 2021-10-24 DIAGNOSIS — H539 Unspecified visual disturbance: Secondary | ICD-10-CM | POA: Diagnosis not present

## 2021-10-24 DIAGNOSIS — R2990 Unspecified symptoms and signs involving the nervous system: Secondary | ICD-10-CM

## 2021-10-24 MED ORDER — PREDNISONE 20 MG PO TABS: ORAL_TABLET | ORAL | 0 refills | Status: DC

## 2021-10-24 MED ORDER — ROSUVASTATIN CALCIUM 5 MG PO TABS: 5.0000 mg | ORAL_TABLET | Freq: Every day | ORAL | 3 refills | Status: DC

## 2021-10-24 NOTE — Patient Instructions (Addendum)
It was good to see you again today- I am sorry that you have had these issues recently I will get in touch with Dr Leonie Man for you Let me know how the crestor does for you

## 2021-10-27 ENCOUNTER — Encounter: Payer: Self-pay | Admitting: Family Medicine

## 2021-10-27 DIAGNOSIS — R5383 Other fatigue: Secondary | ICD-10-CM

## 2021-11-24 ENCOUNTER — Encounter: Payer: Self-pay | Admitting: Family Medicine

## 2021-12-14 ENCOUNTER — Ambulatory Visit (INDEPENDENT_AMBULATORY_CARE_PROVIDER_SITE_OTHER): Payer: BC Managed Care – PPO | Admitting: Neurology

## 2021-12-14 ENCOUNTER — Encounter: Payer: Self-pay | Admitting: Neurology

## 2021-12-14 VITALS — BP 140/99 | HR 62 | Ht 70.0 in | Wt 233.0 lb

## 2021-12-14 DIAGNOSIS — H539 Unspecified visual disturbance: Secondary | ICD-10-CM

## 2021-12-14 DIAGNOSIS — G43009 Migraine without aura, not intractable, without status migrainosus: Secondary | ICD-10-CM | POA: Diagnosis not present

## 2021-12-14 NOTE — Patient Instructions (Addendum)
I had a long discussion with the patient regarding his recurrent episodes of transient right eye vision disturbance which sound like visual migraines.  He has had a normal eye exam and structural brain imaging.  Recommend checking CT angiogram of brain and neck to rule out any vascular abnormalities.  Asked him to avoid possible triggers for migraines.  No specific medications are necessary at this time since episodes are quite infrequent.  Continue aspirin for stroke prevention and maintain aggressive risk factor modification with strict control of hypertension with blood pressure goal below 130/90, lipids with LDL cholesterol goal below 100 mg percent and eat a healthy diet and exercise regularly and lose weight.  He will return for follow-up in the future only as needed. ?

## 2021-12-14 NOTE — Progress Notes (Signed)
?[name] ?[address] ?[place]. [place] [address] ?[phone] ? ?     OFFICE CONSULT NOTE ? ?Mr. Henry Wallace ?Date of Birth:  [birth year] ?Medical Record Number:  [id number]  ? ?Referring MD:  Janett Billow Copland ? ?Reason for Referral:  vision disturbance ? ?HPI: Henry Wallace is a 61 year old pleasant Caucasian male seen today for initial office consultation visit.  History is obtained from the patient and review of referral notes and electronic medical records.  I personally reviewed pertinent available imaging films in PACS.  He has past medical history of hypertension hyperlipidemia and chronic sinusitis.  He states he developed sudden onset of right eye vision disturbance on 10/14/2021.  He noticed a large area of blurred vision on the right and central portion in the right eye.  It had irregular margin which had some shining lights at the periphery.  This would move if he moved his head.  He denies any accompanying headache or other focal neurological symptoms.  This occurred twice and lasted 10 to 15 minutes about half an hour apart.  He was seen by his primary care physician referred him to an eye doctor.  The eye doctor did a dilated eye exam and thought it was migraine and found no abnormalities in the back of his eyes.  He had a CT scan of the head 10/14/2021 which was unremarkable subsequently had an outpatient MRI scan of the brain on 10/17/2021 which was also normal except for changes of chronic paranasal sinusitis.  He has been started on amoxicillin for that and still taking it.  He does give history of remote migraine headaches his youth the did have some visual disturbance with the headaches at that time but he had outgrown them.  He denies any prior history of strokes, TIA, seizures or any other significant neurological problems.  He says he had a third recurrence of the vision disturbance 6 weeks ago while he was waiting at an airport in [place].  It was similar and involves  only the right eye and lasted 10 minutes.  There is no headache at this time as well.  He is unable to identify a specific trigger for these episodes. ? ?ROS:   ?14 system review of systems is positive for vision difficulty only all other systems negative ? ?PMH:  ?Past Medical History:  ?Diagnosis Date  ? Hypertension   ? ? ?Social History:  ?Social History  ? ?Socioeconomic History  ? Marital status: Married  ?  Spouse name: Not on file  ? Number of children: Not on file  ? Years of education: Not on file  ? Highest education level: Not on file  ?Occupational History  ? Occupation: Optician, dispensing  ?Tobacco Use  ? Smoking status: Never  ? Smokeless tobacco: Never  ?Substance and Sexual Activity  ? Alcohol use: Yes  ?  Alcohol/week: 0.0 standard drinks  ?  Comment: 8  ? Drug use: No  ? Sexual activity: Yes  ?Other Topics Concern  ? Not on file  ?Social History Narrative  ? Married. Education: [employer].   ? ?Social Determinants of Health  ? ?Financial Resource Strain: Not on file  ?Food Insecurity: Not on file  ?Transportation Needs: Not on file  ?Physical Activity: Not on file  ?Stress: Not on file  ?Social Connections: Not on file  ?Intimate Partner Violence: Not on file  ? ? ?Medications:   ?Current Outpatient Medications on File Prior to Visit  ?Medication Sig Dispense Refill  ?  5-Hydroxytryptophan (5-HTP) 100 MG CAPS Take by mouth.    ? amLODipine (NORVASC) 10 MG tablet TAKE 1 TABLET(10 MG) BY MOUTH DAILY 90 tablet 3  ? aspirin 81 MG chewable tablet Chew by mouth daily.    ? Cholecalciferol (VITAMIN D3) 75 MCG (3000 UT) TABS Take by mouth.    ? citalopram (CELEXA) 20 MG tablet TAKE 1 TABLET(20 MG) BY MOUTH DAILY 90 tablet 3  ? hydrochlorothiazide (HYDRODIURIL) 25 MG tablet TAKE 1 TABLET BY MOUTH DAILY 90 tablet 3  ? multivitamin-iron-minerals-folic acid (CENTRUM) chewable tablet Chew 1 tablet by mouth daily.    ? OVER THE COUNTER MEDICATION Vitamin D 3 5000 mg taking daily    ? rosuvastatin (CRESTOR) 5 MG  tablet Take 1 tablet (5 mg total) by mouth daily. 30 tablet 3  ? ?No current facility-administered medications on file prior to visit.  ? ? ?Allergies:  No Known Allergies ? ?Physical Exam ?General: well developed, well nourished, seated, in no evident distress ?Head: head normocephalic and atraumatic.   ?Neck: supple with no carotid or supraclavicular bruits ?Cardiovascular: regular rate and rhythm, no murmurs ?Musculoskeletal: no deformity ?Skin:  no rash/petichiae ?Vascular:  Normal pulses all extremities ? ?Neurologic Exam ?Mental Status: Awake and fully alert. Oriented to place and time. Recent and remote memory intact. Attention span, concentration and fund of knowledge appropriate. Mood and affect appropriate.  ?Cranial Nerves: Fundoscopic exam reveals sharp disc margins. Pupils equal, briskly reactive to light. Extraocular movements full without nystagmus. Visual fields full to confrontation. Hearing intact. Facial sensation intact. Face, tongue, palate moves normally and symmetrically.  ?Motor: Normal bulk and tone. Normal strength in all tested extremity muscles. ?Sensory.: intact to touch , pinprick , position and vibratory sensation.  ?Coordination: Rapid alternating movements normal in all extremities. Finger-to-nose and heel-to-shin performed accurately bilaterally. ?Gait and Station: Arises from chair without difficulty. Stance is normal. Gait demonstrates normal stride length and balance . Able to heel, toe and tandem walk without difficulty.  ?Reflexes: 1+ and symmetric. Toes downgoing.  ? ?NIHSS 0 ?Modified Rankin 0 ? ? ?ASSESSMENT: 61 year old Caucasian male with 3 episodes of transient right eye vision disturbance possibly visual migraines since headache.  Normal eye exam and neuroimaging . ? ? ? ? ?PLAN: I had a long discussion with the patient regarding his recurrent episodes of transient right eye vision disturbance which sound like visual migraines.  He has had a normal eye exam and  structural brain imaging.  Recommend checking CT angiogram of brain and neck to rule out any vascular abnormalities.  Asked him to avoid possible triggers for migraines.  No specific medications are necessary at this time since episodes are quite infrequent.  He will return for follow-up in the future only as needed.  Greater than 50% time during this 45-minute consultation visit was spent on counseling and coordination of care about his vision disturbance and migraines and answering questions. ?Antony Contras MD ? ?Note: This document was prepared with digital dictation and possible smart phrase technology. Any transcriptional errors that result from this process are unintentional. ? ? ?

## 2021-12-18 ENCOUNTER — Encounter: Payer: Self-pay | Admitting: Family Medicine

## 2021-12-19 ENCOUNTER — Telehealth: Payer: Self-pay | Admitting: Neurology

## 2021-12-19 NOTE — Telephone Encounter (Signed)
[employer] Josem Kaufmann: [id number] (exp. 12/19/21 to 01/17/22) order sent to GI, they will reach out to the patient to schedule.  ?

## 2022-01-12 ENCOUNTER — Ambulatory Visit
Admission: RE | Admit: 2022-01-12 | Discharge: 2022-01-12 | Disposition: A | Discharge status: Home / Self Care | Payer: BC Managed Care – PPO | Source: Ambulatory Visit | Attending: Neurology | Admitting: Neurology

## 2022-01-12 DIAGNOSIS — G43909 Migraine, unspecified, not intractable, without status migrainosus: Secondary | ICD-10-CM | POA: Diagnosis not present

## 2022-01-12 DIAGNOSIS — J3489 Other specified disorders of nose and nasal sinuses: Secondary | ICD-10-CM | POA: Diagnosis not present

## 2022-01-12 DIAGNOSIS — M47812 Spondylosis without myelopathy or radiculopathy, cervical region: Secondary | ICD-10-CM | POA: Diagnosis not present

## 2022-01-12 DIAGNOSIS — I6522 Occlusion and stenosis of left carotid artery: Secondary | ICD-10-CM | POA: Diagnosis not present

## 2022-01-12 DIAGNOSIS — H539 Unspecified visual disturbance: Secondary | ICD-10-CM | POA: Diagnosis not present

## 2022-01-12 MED ORDER — IOPAMIDOL (ISOVUE-370) INJECTION 76%
75.0000 mL | Freq: Once | INTRAVENOUS | Status: AC | PRN
  Administered 2022-01-12: 75 mL via INTRAVENOUS

## 2022-01-14 NOTE — Progress Notes (Signed)
[place] at [facility] [address], [place], [place] [address] [phone]  Date:  01/16/2022   Name:  Henry Wallace   DOB:  08-05-1961   MRN:  [id number]  PCP:  Darreld Mclean, MD    Chief Complaint: Follow-up (Concerns/ questions: 1. Pt and wife has some questions that he needs answered. /)   History of Present Illness:  Henry Wallace is a 61 y.o. very pleasant male patient who presents with the following:  Patient seen today for follow-up-history of hypertension and dyslipidemia Most recent visit with myself was in [facility] February [place] had an episode of vision change, thought to be ocular migraine.  CT and MRI of his head looked okay except for chronic sinusitis, carotid ultrasound also done and negative  He was seen by neurology about 1 month ago-follow-up as needed I did start him on Crestor in February  They just returned from a good bit of traveling  His wife is with him today, they have made a list of concerns They are concerned about sx over a year or more- they note issues with low energy, fatigue, poor focus and possible "mental acuity" decrease He had more difficulty planning some travel for them recently than he normally would They feel like he has low motivation to exercise Lower sex drive Some ED - at times only They notice dry skin Weight is not coming down - he is trying to stick with his diet He has lost a bit since the start of the year  Some sx of depression "Significant decrease in his muscle size and strength" They don't think sleep is the issue- no change here   Wt Readings from Last 3 Encounters:  01/16/22 233 lb (105.7 kg)  12/14/21 233 lb (105.7 kg)  10/24/21 233 lb 12.8 oz (106.1 kg)     Patient Active Problem List   Diagnosis Date Noted   Carotid stenosis, asymptomatic, bilateral 10/18/2021   MDD (major depressive disorder), recurrent, in full remission ([place]) 11/04/2015    OBESITY, UNSPECIFIED 07/06/2009   HYPERTENSION, BENIGN 07/06/2009   CHEST PAIN, PRECORDIAL 07/06/2009    Past Medical History:  Diagnosis Date   Hypertension     Past Surgical History:  Procedure Laterality Date   HERNIA REPAIR     PROSTATE SURGERY     VASECTOMY      Social History   Tobacco Use   Smoking status: Never   Smokeless tobacco: Never  Substance Use Topics   Alcohol use: Yes    Alcohol/week: 0.0 standard drinks    Comment: 8   Drug use: No    Family History  Problem Relation Age of Onset   Diabetes Maternal Grandmother    Cancer Maternal Grandfather    Heart disease Paternal Grandfather     No Known Allergies  Medication list has been reviewed and updated.  Current Outpatient Medications on File Prior to Visit  Medication Sig Dispense Refill   5-Hydroxytryptophan (5-HTP) 100 MG CAPS Take by mouth.     amLODipine (NORVASC) 10 MG tablet TAKE 1 TABLET(10 MG) BY MOUTH DAILY 90 tablet 3   aspirin 81 MG chewable tablet Chew by mouth daily.     Cholecalciferol 125 MCG (5000 UT) capsule Take 5,000 Units by mouth daily.     citalopram (CELEXA) 20 MG tablet TAKE 1 TABLET(20 MG) BY MOUTH DAILY 90 tablet 3   hydrochlorothiazide (HYDRODIURIL) 25 MG tablet TAKE 1 TABLET  BY MOUTH DAILY 90 tablet 3   multivitamin-iron-minerals-folic acid (CENTRUM) chewable tablet Chew 1 tablet by mouth daily.     OVER THE COUNTER MEDICATION Vitamin D 3 5000 mg taking daily     rosuvastatin (CRESTOR) 5 MG tablet Take 1 tablet (5 mg total) by mouth daily. 30 tablet 3   No current facility-administered medications on file prior to visit.    Review of Systems:  As per HPI- otherwise negative.   Physical Examination: Vitals:   01/16/22 1357  BP: 130/72  Pulse: 79  Resp: 18  Temp: 97.8 F (36.6 C)  SpO2: 97%   Vitals:   01/16/22 1357  Weight: 233 lb (105.7 kg)  Height: '5\' 9"'$  (1.753 m)   Body mass index is 34.41 kg/m. Ideal Body Weight: Weight in (lb) to have BMI = 25:  168.9  GEN: no acute distress.  Obese, looks well HEENT: Atraumatic, Normocephalic.  Ears and Nose: No external deformity. CV: RRR, No M/G/R. No JVD. No thrill. No extra heart sounds. PULM: CTA B, no wheezes, crackles, rhonchi. No retractions. No resp. distress. No accessory muscle use. EXTR: No c/c/e PSYCH: Normally interactive. Conversant.    Assessment and Plan: Low energy - Plan: B12, Cortisol, Testosterone Total,Free,Bio, Males-([facility]), TSH, VITAMIN D 25 Hydroxy (Vit-D Deficiency, Fractures), Ferritin, CANCELED: Testosterone Total,Free,Bio, Males-([facility]), CANCELED: TSH, CANCELED: B12, CANCELED: Cortisol, CANCELED: VITAMIN D 25 Hydroxy (Vit-D Deficiency, Fractures)  Elevated glucose - Plan: Hemoglobin A1c, CANCELED: Hemoglobin A1c  Screening for prostate cancer - Plan: PSA, CANCELED: PSA  Carotid stenosis, asymptomatic, bilateral - Plan: Lipid panel  Patient seen today with concern of low energy and other symptoms as detailed above.  Lab work is pending, he will come in for an a.m. lab draw this week and we will follow-up pending results  Signed Lamar Blinks, MD

## 2022-01-16 ENCOUNTER — Ambulatory Visit (INDEPENDENT_AMBULATORY_CARE_PROVIDER_SITE_OTHER): Payer: BC Managed Care – PPO | Admitting: Family Medicine

## 2022-01-16 VITALS — BP 130/72 | HR 79 | Temp 97.8°F | Resp 18 | Ht 69.0 in | Wt 233.0 lb

## 2022-01-16 DIAGNOSIS — Z125 Encounter for screening for malignant neoplasm of prostate: Secondary | ICD-10-CM | POA: Diagnosis not present

## 2022-01-16 DIAGNOSIS — I6523 Occlusion and stenosis of bilateral carotid arteries: Secondary | ICD-10-CM

## 2022-01-16 DIAGNOSIS — R7309 Other abnormal glucose: Secondary | ICD-10-CM

## 2022-01-16 DIAGNOSIS — R5383 Other fatigue: Secondary | ICD-10-CM

## 2022-01-16 DIAGNOSIS — N529 Male erectile dysfunction, unspecified: Secondary | ICD-10-CM

## 2022-01-16 NOTE — Patient Instructions (Signed)
It was good to see you today- I will be in touch with your labs Please schedule an early morning lab asap and we will check on things for you  If we don't turn up any answers- ?cholesterol med ?sleep study ?change celexa to something else

## 2022-01-17 ENCOUNTER — Encounter: Payer: Self-pay | Admitting: Family Medicine

## 2022-01-17 ENCOUNTER — Other Ambulatory Visit (INDEPENDENT_AMBULATORY_CARE_PROVIDER_SITE_OTHER): Payer: BC Managed Care – PPO

## 2022-01-17 DIAGNOSIS — Z125 Encounter for screening for malignant neoplasm of prostate: Secondary | ICD-10-CM

## 2022-01-17 DIAGNOSIS — R7309 Other abnormal glucose: Secondary | ICD-10-CM | POA: Diagnosis not present

## 2022-01-17 DIAGNOSIS — R5383 Other fatigue: Secondary | ICD-10-CM

## 2022-01-17 DIAGNOSIS — N529 Male erectile dysfunction, unspecified: Secondary | ICD-10-CM | POA: Diagnosis not present

## 2022-01-17 LAB — HEMOGLOBIN A1C: Hgb A1c MFr Bld: 5.5 % (ref 4.6–6.5)

## 2022-01-17 LAB — PSA: PSA: 2.06 ng/mL (ref 0.10–4.00)

## 2022-01-17 LAB — VITAMIN B12: Vitamin B-12: 498 pg/mL (ref 211–911)

## 2022-01-17 LAB — FERRITIN: Ferritin: 113 ng/mL (ref 22.0–322.0)

## 2022-01-17 LAB — TSH: TSH: 3.57 u[IU]/mL (ref 0.35–5.50)

## 2022-01-17 LAB — CORTISOL: Cortisol, Plasma: 6.6 ug/dL

## 2022-01-17 LAB — VITAMIN D 25 HYDROXY (VIT D DEFICIENCY, FRACTURES): VITD: 79.75 ng/mL (ref 30.00–100.00)

## 2022-01-18 ENCOUNTER — Encounter: Payer: Self-pay | Admitting: Family Medicine

## 2022-01-18 LAB — TESTOSTERONE TOTAL,FREE,BIO, MALES
Albumin: 4.7 g/dL (ref 3.6–5.1)
Sex Hormone Binding: 26 nmol/L (ref 22–77)
Testosterone, Bioavailable: 134.3 ng/dL (ref 110.0–575.0)
Testosterone, Free: 62.7 pg/mL (ref 46.0–224.0)
Testosterone: 402 ng/dL (ref 250–827)

## 2022-01-31 DIAGNOSIS — H903 Sensorineural hearing loss, bilateral: Secondary | ICD-10-CM | POA: Diagnosis not present

## 2022-02-01 ENCOUNTER — Encounter: Payer: Self-pay | Admitting: Family Medicine

## 2022-02-08 ENCOUNTER — Ambulatory Visit: Payer: BLUE CROSS/BLUE SHIELD | Admitting: Neurology

## 2022-02-16 ENCOUNTER — Other Ambulatory Visit: Payer: Self-pay | Admitting: Family Medicine

## 2022-02-16 DIAGNOSIS — I6523 Occlusion and stenosis of bilateral carotid arteries: Secondary | ICD-10-CM

## 2022-02-21 DIAGNOSIS — H903 Sensorineural hearing loss, bilateral: Secondary | ICD-10-CM | POA: Diagnosis not present

## 2022-02-22 ENCOUNTER — Encounter: Payer: Self-pay | Admitting: Family Medicine

## 2022-05-11 ENCOUNTER — Other Ambulatory Visit: Payer: Self-pay | Admitting: Family Medicine

## 2022-05-11 DIAGNOSIS — I10 Essential (primary) hypertension: Secondary | ICD-10-CM

## 2022-05-24 ENCOUNTER — Encounter: Payer: BC Managed Care – PPO | Admitting: Family Medicine

## 2022-05-27 NOTE — Patient Instructions (Incomplete)
It was good to see you today, I will be in touch with your labs I would recommend getting the newest COVID shot this fall, also consider a dose of RSV Flu shot is done   Would suggest getting about 30- 60 minutes of exercise most days of the week  We will get a coronary CT scan

## 2022-05-27 NOTE — Progress Notes (Unsigned)
[place] at [facility] [address], [place], [place] [address] [phone]  Date:  06/01/2022   Name:  Henry Wallace   DOB:  09-18-1960   MRN:  [id number]  PCP:  Darreld Mclean, MD    Chief Complaint: No chief complaint on file.   History of Present Illness:  Henry Wallace is a 61 y.o. very pleasant male patient who presents with the following:  Patient is seen today for physical exam-history of hypertension, dyslipidemia, episode of ocular migraine in February of this year Most recent visit with myself was in May of this year-at that time patient and his wife were concerned about low energy, fatigue, loss of muscle mass, decreased sex drive. We checked his testosterone and it was in normal range for age at 400-I double check with endocrinology, agreed to that testosterone replacement not appropriate at this time I suggested proceeding to neurology evaluation, perhaps change Celexa to Wellbutrin but as of yet he did not wish to do anything further  Flu vaccine Colon cancer screening up-to-date Shingrix, COVID are done  Amlodipine 10 Aspirin 81 HCTZ 25 Crestor Celexa  Can offer coronary calcium Never smoker  Routine labs done in February, in May we checked iron, vitamin D, B12, testosterone, thyroid, PSA Lab Results  Component Value Date   PSA 2.06 01/17/2022   PSA 1.55 05/19/2021   PSA 1.4 04/19/2020    PSA had trended up a bit, would like to recheck today  Patient Active Problem List   Diagnosis Date Noted   Carotid stenosis, asymptomatic, bilateral 10/18/2021   MDD (major depressive disorder), recurrent, in full remission ([place]) 11/04/2015   OBESITY, UNSPECIFIED 07/06/2009   HYPERTENSION, BENIGN 07/06/2009   CHEST PAIN, PRECORDIAL 07/06/2009    Past Medical History:  Diagnosis Date   Hypertension     Past Surgical History:  Procedure Laterality Date   HERNIA REPAIR     PROSTATE SURGERY      VASECTOMY      Social History   Tobacco Use   Smoking status: Never   Smokeless tobacco: Never  Substance Use Topics   Alcohol use: Yes    Alcohol/week: 0.0 standard drinks of alcohol    Comment: 8   Drug use: No    Family History  Problem Relation Age of Onset   Diabetes Maternal Grandmother    Cancer Maternal Grandfather    Heart disease Paternal Grandfather     No Known Allergies  Medication list has been reviewed and updated.  Current Outpatient Medications on File Prior to Visit  Medication Sig Dispense Refill   5-Hydroxytryptophan (5-HTP) 100 MG CAPS Take by mouth.     amLODipine (NORVASC) 10 MG tablet TAKE 1 TABLET(10 MG) BY MOUTH DAILY 90 tablet 3   aspirin 81 MG chewable tablet Chew by mouth daily.     Cholecalciferol 125 MCG (5000 UT) capsule Take 5,000 Units by mouth daily.     citalopram (CELEXA) 20 MG tablet TAKE 1 TABLET(20 MG) BY MOUTH DAILY 90 tablet 3   hydrochlorothiazide (HYDRODIURIL) 25 MG tablet Take 1 tablet (25 mg total) by mouth daily. 90 tablet 0   multivitamin-iron-minerals-folic acid (CENTRUM) chewable tablet Chew 1 tablet by mouth daily.     OVER THE COUNTER MEDICATION Vitamin D 3 5000 mg taking daily     rosuvastatin (CRESTOR) 5 MG tablet TAKE 1 TABLET(5 MG) BY MOUTH DAILY 90 tablet 1   No current facility-administered  medications on file prior to visit.    Review of Systems:  As per HPI- otherwise negative.   Physical Examination: There were no vitals filed for this visit. There were no vitals filed for this visit. There is no height or weight on file to calculate BMI. Ideal Body Weight:    GEN: no acute distress. HEENT: Atraumatic, Normocephalic.  Ears and Nose: No external deformity. CV: RRR, No M/G/R. No JVD. No thrill. No extra heart sounds. PULM: CTA B, no wheezes, crackles, rhonchi. No retractions. No resp. distress. No accessory muscle use. ABD: S, NT, ND, +BS. No rebound. No HSM. EXTR: No c/c/e PSYCH: Normally  interactive. Conversant.    Assessment and Plan: *** Physical exam today.  Encouraged healthy diet and exercise routine Will plan further follow- up pending labs.  Signed Lamar Blinks, MD

## 2022-06-01 ENCOUNTER — Encounter: Payer: Self-pay | Admitting: Family Medicine

## 2022-06-01 ENCOUNTER — Ambulatory Visit (INDEPENDENT_AMBULATORY_CARE_PROVIDER_SITE_OTHER): Payer: BC Managed Care – PPO | Admitting: Family Medicine

## 2022-06-01 VITALS — BP 124/82 | HR 60 | Temp 97.8°F | Resp 18 | Ht 69.0 in | Wt 236.2 lb

## 2022-06-01 DIAGNOSIS — E785 Hyperlipidemia, unspecified: Secondary | ICD-10-CM | POA: Diagnosis not present

## 2022-06-01 DIAGNOSIS — R972 Elevated prostate specific antigen [PSA]: Secondary | ICD-10-CM

## 2022-06-01 DIAGNOSIS — Z0000 Encounter for general adult medical examination without abnormal findings: Secondary | ICD-10-CM | POA: Diagnosis not present

## 2022-06-01 DIAGNOSIS — Z131 Encounter for screening for diabetes mellitus: Secondary | ICD-10-CM

## 2022-06-01 DIAGNOSIS — F325 Major depressive disorder, single episode, in full remission: Secondary | ICD-10-CM

## 2022-06-01 DIAGNOSIS — Z125 Encounter for screening for malignant neoplasm of prostate: Secondary | ICD-10-CM | POA: Diagnosis not present

## 2022-06-01 DIAGNOSIS — Z130 Encounter for screening for diseases of the blood and blood-forming organs and certain disorders involving the immune mechanism: Secondary | ICD-10-CM

## 2022-06-01 DIAGNOSIS — I10 Essential (primary) hypertension: Secondary | ICD-10-CM | POA: Diagnosis not present

## 2022-06-01 DIAGNOSIS — I6523 Occlusion and stenosis of bilateral carotid arteries: Secondary | ICD-10-CM

## 2022-06-01 LAB — LIPID PANEL
Cholesterol: 113 mg/dL (ref 0–200)
HDL: 34 mg/dL — ABNORMAL LOW (ref >39.00)
LDL Cholesterol: 47 mg/dL (ref 0–99)
NonHDL: 78.54
Total CHOL/HDL Ratio: 3
Triglycerides: 156 mg/dL — ABNORMAL HIGH (ref 0.0–149.0)
VLDL: 31.2 mg/dL (ref 0.0–40.0)

## 2022-06-01 LAB — COMPREHENSIVE METABOLIC PANEL
ALT: 45 U/L (ref 0–53)
AST: 30 U/L (ref 0–37)
Albumin: 4.9 g/dL (ref 3.5–5.2)
Alkaline Phosphatase: 78 U/L (ref 39–117)
BUN: 17 mg/dL (ref 6–23)
CO2: 31 mEq/L (ref 19–32)
Calcium: 9.9 mg/dL (ref 8.4–10.5)
Chloride: 100 mEq/L (ref 96–112)
Creatinine, Ser: 0.98 mg/dL (ref 0.40–1.50)
GFR: 82.42 mL/min (ref >60.00)
Glucose, Bld: 99 mg/dL (ref 70–99)
Potassium: 4.1 mEq/L (ref 3.5–5.1)
Sodium: 139 mEq/L (ref 135–145)
Total Bilirubin: 0.9 mg/dL (ref 0.2–1.2)
Total Protein: 7.1 g/dL (ref 6.0–8.3)

## 2022-06-01 LAB — CBC
HCT: 43.1 % (ref 39.0–52.0)
Hemoglobin: 14.8 g/dL (ref 13.0–17.0)
MCHC: 34.3 g/dL (ref 30.0–36.0)
MCV: 92.4 fl (ref 78.0–100.0)
Platelets: 248 10*3/uL (ref 150.0–400.0)
RBC: 4.67 Mil/uL (ref 4.22–5.81)
RDW: 13.4 % (ref 11.5–15.5)
WBC: 6.2 10*3/uL (ref 4.0–10.5)

## 2022-06-01 LAB — PSA: PSA: 3.44 ng/mL (ref 0.10–4.00)

## 2022-06-01 LAB — HEMOGLOBIN A1C: Hgb A1c MFr Bld: 5.6 % (ref 4.6–6.5)

## 2022-06-01 MED ORDER — CITALOPRAM HYDROBROMIDE 20 MG PO TABS: ORAL_TABLET | ORAL | 3 refills | Status: DC

## 2022-06-01 MED ORDER — AMLODIPINE BESYLATE 10 MG PO TABS: ORAL_TABLET | ORAL | 3 refills | Status: DC

## 2022-06-01 MED ORDER — ROSUVASTATIN CALCIUM 5 MG PO TABS: ORAL_TABLET | ORAL | 3 refills | Status: DC

## 2022-06-01 MED ORDER — HYDROCHLOROTHIAZIDE 25 MG PO TABS: 25.0000 mg | ORAL_TABLET | Freq: Every day | ORAL | 3 refills | Status: DC

## 2022-06-01 NOTE — Addendum Note (Signed)
Addended by: Lamar Blinks C on: 06/01/2022 04:54 PM   Modules accepted: Orders

## 2022-06-09 ENCOUNTER — Ambulatory Visit (HOSPITAL_BASED_OUTPATIENT_CLINIC_OR_DEPARTMENT_OTHER)
Admission: RE | Admit: 2022-06-09 | Discharge: 2022-06-09 | Disposition: A | Discharge status: Home / Self Care | Payer: BC Managed Care – PPO | Source: Ambulatory Visit | Attending: Family Medicine | Admitting: Family Medicine

## 2022-06-09 DIAGNOSIS — I10 Essential (primary) hypertension: Secondary | ICD-10-CM | POA: Insufficient documentation

## 2022-06-09 DIAGNOSIS — E785 Hyperlipidemia, unspecified: Secondary | ICD-10-CM | POA: Insufficient documentation

## 2022-06-09 DIAGNOSIS — Z131 Encounter for screening for diabetes mellitus: Secondary | ICD-10-CM | POA: Insufficient documentation

## 2022-06-10 ENCOUNTER — Encounter: Payer: Self-pay | Admitting: Family Medicine

## 2022-06-10 DIAGNOSIS — I10 Essential (primary) hypertension: Secondary | ICD-10-CM

## 2022-06-10 DIAGNOSIS — E785 Hyperlipidemia, unspecified: Secondary | ICD-10-CM

## 2022-06-10 DIAGNOSIS — I6523 Occlusion and stenosis of bilateral carotid arteries: Secondary | ICD-10-CM

## 2022-06-12 ENCOUNTER — Telehealth: Payer: Self-pay | Admitting: Family Medicine

## 2022-06-12 ENCOUNTER — Encounter: Payer: Self-pay | Admitting: Family Medicine

## 2022-06-12 DIAGNOSIS — R590 Localized enlarged lymph nodes: Secondary | ICD-10-CM

## 2022-06-12 MED ORDER — ROSUVASTATIN CALCIUM 10 MG PO TABS: ORAL_TABLET | ORAL | 3 refills | Status: DC

## 2022-06-12 NOTE — Telephone Encounter (Signed)
Called patient to go over extracardiac findings from CT coronary  FINDINGS: Distal periesophageal and distal periaortic lymph nodes measure up to 1.4 cm adjacent to the distal esophagus, near the gastroesophageal junction (3/41). Scattered pulmonary parenchymal scarring. No suspicious pulmonary nodules.   IMPRESSION: Small to borderline enlarged distal periesophageal and distal periaortic lymph nodes are indeterminate. A lymphoproliferative disorder or metastatic disease from a distal esophageal primary malignancy cannot be excluded. Consider CT chest with contrast in further evaluation. These results will be called to the ordering clinician or representative by the Radiologist Assistant, and communication documented in the PACS or [place].------------------  Explained that there is concerned about abnormal lymph nodes, potential for esophageal cancer.  We will get a CT chest for him ASAP He would like to do this on 10/18 Recent labs are on chart

## 2022-06-13 ENCOUNTER — Encounter: Payer: Self-pay | Admitting: Family Medicine

## 2022-06-14 NOTE — Addendum Note (Signed)
Addended by: Lamar Blinks C on: 06/14/2022 12:05 PM   Modules accepted: Orders

## 2022-06-15 ENCOUNTER — Other Ambulatory Visit: Payer: BC Managed Care – PPO

## 2022-06-15 ENCOUNTER — Ambulatory Visit (INDEPENDENT_AMBULATORY_CARE_PROVIDER_SITE_OTHER): Payer: BC Managed Care – PPO

## 2022-06-15 DIAGNOSIS — I700 Atherosclerosis of aorta: Secondary | ICD-10-CM | POA: Diagnosis not present

## 2022-06-15 DIAGNOSIS — R590 Localized enlarged lymph nodes: Secondary | ICD-10-CM | POA: Diagnosis not present

## 2022-06-15 MED ORDER — IOHEXOL 300 MG/ML  SOLN
100.0000 mL | Freq: Once | INTRAMUSCULAR | Status: AC | PRN
  Administered 2022-06-15: 100 mL via INTRAVENOUS

## 2022-06-17 ENCOUNTER — Encounter: Payer: Self-pay | Admitting: Family Medicine

## 2022-06-20 ENCOUNTER — Other Ambulatory Visit: Payer: Self-pay | Admitting: Family Medicine

## 2022-06-20 DIAGNOSIS — R590 Localized enlarged lymph nodes: Secondary | ICD-10-CM

## 2022-06-27 ENCOUNTER — Encounter: Payer: Self-pay | Admitting: Family Medicine

## 2022-06-30 ENCOUNTER — Other Ambulatory Visit (INDEPENDENT_AMBULATORY_CARE_PROVIDER_SITE_OTHER): Payer: BC Managed Care – PPO

## 2022-06-30 ENCOUNTER — Encounter: Payer: Self-pay | Admitting: Family Medicine

## 2022-06-30 DIAGNOSIS — R972 Elevated prostate specific antigen [PSA]: Secondary | ICD-10-CM

## 2022-06-30 LAB — PSA: PSA: 2.78 ng/mL (ref 0.10–4.00)

## 2022-06-30 NOTE — Addendum Note (Signed)
Addended by: Kem Boroughs D on: 06/30/2022 10:57 AM   Modules accepted: Orders

## 2022-07-05 ENCOUNTER — Encounter: Payer: Self-pay | Admitting: Cardiovascular Disease

## 2022-07-05 ENCOUNTER — Ambulatory Visit: Payer: BC Managed Care – PPO | Attending: Cardiovascular Disease | Admitting: Cardiovascular Disease

## 2022-07-05 VITALS — BP 129/81 | HR 67 | Ht 69.5 in | Wt 232.0 lb

## 2022-07-05 DIAGNOSIS — R931 Abnormal findings on diagnostic imaging of heart and coronary circulation: Secondary | ICD-10-CM | POA: Diagnosis not present

## 2022-07-05 DIAGNOSIS — E782 Mixed hyperlipidemia: Secondary | ICD-10-CM | POA: Diagnosis not present

## 2022-07-05 DIAGNOSIS — I10 Essential (primary) hypertension: Secondary | ICD-10-CM | POA: Diagnosis not present

## 2022-07-05 DIAGNOSIS — E785 Hyperlipidemia, unspecified: Secondary | ICD-10-CM | POA: Insufficient documentation

## 2022-07-05 NOTE — Patient Instructions (Signed)
Medication Instructions:  NO CHANGES  *If you need a refill on your cardiac medications before your next appointment, please call your pharmacy*   Follow-Up: At [facility], you and your health needs are our priority.  As part of our continuing mission to provide you with exceptional heart care, we have created designated Provider Care Teams.  These Care Teams include your primary Cardiologist (physician) and Advanced Practice Providers (APPs -  Physician Assistants and Nurse Practitioners) who all work together to provide you with the care you need, when you need it.  We recommend signing up for the patient portal called "MyChart".  Sign up information is provided on this After Visit Summary.  MyChart is used to connect with patients for Virtual Visits (Telemedicine).  Patients are able to view lab/test results, encounter notes, upcoming appointments, etc.  Non-urgent messages can be sent to your provider as well.   To learn more about what you can do with MyChart, go to [email or web address].    Your next appointment:    12 months with Dr. Gwenlyn Found

## 2022-07-05 NOTE — Progress Notes (Signed)
07/05/2022 LOI RENNAKER   01/04/1961  [id number]  Primary Physician Wallace, Henry Filler, MD Primary Cardiologist: Henry Harp MD Henry Wallace, [place]  HPI:  Henry Wallace is a 61 y.o. fit appearing married Caucasian male father of 1 son, grandfather 1 grandson referred by Dr. Pernell Wallace to be established my practice because of elevated coronary calcium score.  He is accompanied by his wife Henry Wallace today.  She is a Programme researcher, broadcasting/film/video.  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  He is next-door neighbors to Dr. Rica Wallace.  His risk factors include family history with a father who died of myocardial infarction at age 63.  He smoked remotely.  He does have a history of hypertension and hyperlipidemia.  He is never had a heart attack or stroke.  He has had ocular migraines.  He had a coronary calcium score performed 06/09/2022 which was 610 distributed throughout 3 coronary arteries.  Is very active, exercising daily basis and gets more than 10,000 steps a day and is completely asymptomatic.   Current Meds  Medication Sig   5-Hydroxytryptophan (5-HTP) 100 MG CAPS Take by mouth.   amLODipine (NORVASC) 10 MG tablet TAKE 1 TABLET(10 MG) BY MOUTH DAILY   aspirin 81 MG chewable tablet Chew by mouth daily.   citalopram (CELEXA) 20 MG tablet TAKE 1 TABLET(20 MG) BY MOUTH DAILY   hydrochlorothiazide (HYDRODIURIL) 25 MG tablet Take 1 tablet (25 mg total) by mouth daily.   multivitamin-iron-minerals-folic acid (CENTRUM) chewable tablet Chew 1 tablet by mouth daily.   OVER THE COUNTER MEDICATION Vitamin D 3 5000 mg taking daily   rosuvastatin (CRESTOR) 10 MG tablet TAKE 1 TABLET(10 MG) BY MOUTH DAILY     No Known Allergies  Social History   Socioeconomic History   Marital status: Married    Spouse name: Not on file   Number of children: Not on file   Years of education: Not on file   Highest education level: Not on file  Occupational History    Occupation: Optician, dispensing  Tobacco Use   Smoking status: Never   Smokeless tobacco: Never  Substance and Sexual Activity   Alcohol use: Yes    Alcohol/week: 0.0 standard drinks of alcohol    Comment: 8   Drug use: No   Sexual activity: Yes  Other Topics Concern   Not on file  Social History Narrative   Married. Education: [employer].    Social Determinants of Health   Financial Resource Strain: Not on file  Food Insecurity: Not on file  Transportation Needs: Not on file  Physical Activity: Not on file  Stress: Not on file  Social Connections: Not on file  Intimate Partner Violence: Not on file     Review of Systems: General: negative for chills, fever, night sweats or weight changes.  Cardiovascular: negative for chest pain, dyspnea on exertion, edema, orthopnea, palpitations, paroxysmal nocturnal dyspnea or shortness of breath Dermatological: negative for rash Respiratory: negative for cough or wheezing Urologic: negative for hematuria Abdominal: negative for nausea, vomiting, diarrhea, bright red blood per rectum, melena, or hematemesis Neurologic: negative for visual changes, syncope, or dizziness All other systems reviewed and are otherwise negative except as noted above.    Blood pressure 129/81, pulse 67, height 5' 9.5" (1.765 m), weight 232 lb (105.2 kg), SpO2 98 %.  General appearance: alert and no distress Neck: no adenopathy, no carotid bruit, no JVD, supple, symmetrical, trachea midline, and thyroid not  enlarged, symmetric, no tenderness/mass/nodules Lungs: clear to auscultation bilaterally Heart: regular rate and rhythm, S1, S2 normal, no murmur, click, rub or gallop Extremities: extremities normal, atraumatic, no cyanosis or edema Pulses: 2+ and symmetric Skin: Skin color, texture, turgor normal. No rashes or lesions Neurologic: Grossly normal  EKG sinus rhythm at 67 without ST or T wave changes.  Personally reviewed this EKG.  ASSESSMENT AND PLAN:    HYPERTENSION, BENIGN History of essential hypertension a blood pressure measured today at 129/81.  He is on amlodipine, and hydrochlorothiazide.  Hyperlipidemia History of hyperlipidemia on rosuvastatin with recent lipid profile performed 06/01/2022 revealing total cholesterol 113, LDL 47 and HDL 34.  Elevated coronary artery calcium score Coronary calcium score performed 06/09/2022 was 610 with calcium distributed in all 3 coronary arteries.  He is very active and completely asymptomatic.  He is on statin therapy with an LDL of 47, at goal for secondary prevention.  He is also on a baby aspirin.     Henry Harp MD FACP,FACC,FAHA, [facility] 07/05/2022 3:30 PM

## 2022-07-05 NOTE — Assessment & Plan Note (Signed)
Coronary calcium score performed 06/09/2022 was 610 with calcium distributed in all 3 coronary arteries.  He is very active and completely asymptomatic.  He is on statin therapy with an LDL of 47, at goal for secondary prevention.  He is also on a baby aspirin.

## 2022-07-05 NOTE — Assessment & Plan Note (Signed)
History of hyperlipidemia on rosuvastatin with recent lipid profile performed 06/01/2022 revealing total cholesterol 113, LDL 47 and HDL 34.

## 2022-07-05 NOTE — Assessment & Plan Note (Signed)
History of essential hypertension a blood pressure measured today at 129/81.  He is on amlodipine, and hydrochlorothiazide.

## 2022-07-12 ENCOUNTER — Ambulatory Visit: Payer: BC Managed Care – PPO | Admitting: Cardiovascular Disease

## 2022-07-17 ENCOUNTER — Encounter: Payer: Self-pay | Admitting: Family Medicine

## 2022-07-27 DIAGNOSIS — R9389 Abnormal findings on diagnostic imaging of other specified body structures: Secondary | ICD-10-CM | POA: Diagnosis not present

## 2022-07-27 DIAGNOSIS — Z86010 Personal history of colonic polyps: Secondary | ICD-10-CM | POA: Diagnosis not present

## 2022-08-17 ENCOUNTER — Other Ambulatory Visit: Payer: Self-pay | Admitting: Family Medicine

## 2022-08-17 ENCOUNTER — Encounter: Payer: Self-pay | Admitting: Family Medicine

## 2022-08-17 DIAGNOSIS — R590 Localized enlarged lymph nodes: Secondary | ICD-10-CM

## 2022-08-23 ENCOUNTER — Encounter: Payer: Self-pay | Admitting: Internal Medicine

## 2022-09-08 ENCOUNTER — Encounter: Payer: Self-pay | Admitting: *Deleted

## 2022-09-13 ENCOUNTER — Encounter: Payer: Self-pay | Admitting: Internal Medicine

## 2022-09-13 ENCOUNTER — Ambulatory Visit (INDEPENDENT_AMBULATORY_CARE_PROVIDER_SITE_OTHER): Payer: BC Managed Care – PPO | Admitting: Internal Medicine

## 2022-09-13 VITALS — BP 126/72 | HR 60 | Ht 70.0 in | Wt 237.6 lb

## 2022-09-13 DIAGNOSIS — R9389 Abnormal findings on diagnostic imaging of other specified body structures: Secondary | ICD-10-CM

## 2022-09-13 DIAGNOSIS — Z86010 Personal history of colonic polyps: Secondary | ICD-10-CM | POA: Diagnosis not present

## 2022-09-13 DIAGNOSIS — R590 Localized enlarged lymph nodes: Secondary | ICD-10-CM

## 2022-09-13 MED ORDER — NA SULFATE-K SULFATE-MG SULF 17.5-3.13-1.6 GM/177ML PO SOLN: 1.0000 | ORAL | 0 refills | Status: DC

## 2022-09-13 NOTE — Patient Instructions (Signed)
You have been scheduled for an endoscopy and colonoscopy. Please follow the written instructions given to you at your visit today. Please pick up your prep supplies at the pharmacy within the next 1-3 days. If you use inhalers (even only as needed), please bring them with you on the day of your procedure.  We have sent the following medications to your pharmacy for you to pick up at your convenience: Suprep   _______________________________________________________  If your blood pressure at your visit was 140/90 or greater, please contact your primary care physician to follow up on this.  _______________________________________________________  If you are age 64 or older, your body mass index should be between 23-30. Your Body mass index is 34.09 kg/m. If this is out of the aforementioned range listed, please consider follow up with your Primary Care Provider.  If you are age 54 or younger, your body mass index should be between 19-25. Your Body mass index is 34.09 kg/m. If this is out of the aformentioned range listed, please consider follow up with your Primary Care Provider.   ________________________________________________________  The [facility] providers would like to encourage you to use [facility] to communicate with providers for non-urgent requests or questions.  Due to long hold times on the telephone, sending your provider a message by [facility] may be a faster and more efficient way to get a response.  Please allow 48 business hours for a response.  Please remember that this is for non-urgent requests.  _______________________________________________________  Thank you for choosing me and [facility].  Dr.Jay Pyrtle

## 2022-09-13 NOTE — Progress Notes (Signed)
Patient ID: Henry Wallace, male   DOB: 1961-03-04, 62 y.o.   MRN: [id number] HPI: Henry Wallace is a 62 year old male with a past medical history of adenomatous colon polyps, colonic diverticulosis, hypertension, fatty liver and migraine headaches who seen in consultation at the request of Dr. Lorelei Pont to evaluate abnormal CT of the chest with paraesophageal lymphadenopathy and to consider colonoscopy for surveillance.  He is here today with his wife.  His prior GI history was with Dr. Paulita Fujita with [facility].  He had a colonoscopy performed in January 2016.  This was for screening.  This was his first colonoscopy.  This revealed 2 polyps the largest being 1 cm which was an adenoma.  He had a CT scan of the chest for cardiac scoring which did show an elevated cardiac calcium score and borderline dilation of the ascending aorta.  The radiology over read revealed distal paraesophageal and periaortic lymph nodes measuring 1.4 cm in the distal esophagus near the GEJ.  There were scattered pulmonary parenchymal scarring but no suspicious nodules. This was followed up with a CT chest with contrast done on 06/15/2022 and this again showed similar appearance of prominent pretracheal and paraesophageal lymph nodes which remain nonspecific.  There were no overtly enlarged mediastinal hilar or axillary nodes.  There is no evidence of mass or other abnormality of the esophagus.  CAD was seen at hepatic steatosis.  From a GI perspective he feels well.  He denies esophageal symptoms.  No heartburn.  No trouble swallowing.  No painful swallowing.  Good appetite.  No nausea or vomiting.  Bowel habits are regular.  When he travels he can become slightly constipated or have disrupted bowel movements.  No blood in stool or melena.  He has changed his diet over the years and been gluten-free since 2010.  He noticed that this caused less bloating, fatigue and brain fog.  He gave up carbonated sodas in 1999.  He also most recently is  no longer eating red meat or pork.  No immediate family history of colon cancer.  His maternal great grandmother had colon cancer. He does drink alcohol but infrequently and not to excess.  Remote tobacco use.  None currently. 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  Past Medical History:  Diagnosis Date   Adenomatous colon polyp    Coronary artery disease    Diverticulosis    Hepatic steatosis    Hypertension    Migraines     Past Surgical History:  Procedure Laterality Date   ADENOIDECTOMY     ANKLE SURGERY Left 1994   HERNIA REPAIR     PROSTATE SURGERY     VASECTOMY      Outpatient Medications Prior to Visit  Medication Sig Dispense Refill   5-Hydroxytryptophan (5-HTP) 100 MG CAPS Take by mouth.     amLODipine (NORVASC) 10 MG tablet TAKE 1 TABLET(10 MG) BY MOUTH DAILY 90 tablet 3   aspirin 81 MG chewable tablet Chew by mouth daily.     citalopram (CELEXA) 20 MG tablet TAKE 1 TABLET(20 MG) BY MOUTH DAILY 90 tablet 3   hydrochlorothiazide (HYDRODIURIL) 25 MG tablet Take 1 tablet (25 mg total) by mouth daily. 90 tablet 3   multivitamin-iron-minerals-folic acid (CENTRUM) chewable tablet Chew 1 tablet by mouth daily.     OVER THE COUNTER MEDICATION Vitamin D 3 5000 mg taking daily     rosuvastatin (CRESTOR) 10 MG tablet TAKE 1 TABLET(10 MG) BY MOUTH DAILY 90 tablet  3   No facility-administered medications prior to visit.    No Known Allergies  Family History  Problem Relation Age of Onset   Diverticulitis Mother    Diabetes Maternal Grandmother    Lung cancer Maternal Grandfather    Heart disease Paternal Grandfather    Colon cancer Neg Hx    Stomach cancer Neg Hx    Esophageal cancer Neg Hx     Social History   Tobacco Use   Smoking status: Former    Types: Cigarettes   Smokeless tobacco: Never  Substance Use Topics   Alcohol use: Yes    Comment: liquor   Drug use: No    ROS: As per history of present illness, otherwise  negative  BP 126/72   Pulse 60   Ht '5\' 10"'$  (1.778 m)   Wt 237 lb 9.6 oz (107.8 kg)   SpO2 95%   BMI 34.09 kg/m  Gen: awake, alert, NAD HEENT: anicteric  CV: RRR, no mrg Pulm: CTA b/l Abd: soft, NT/ND, +BS throughout Ext: no c/c/e Neuro: nonfocal  RELEVANT LABS AND IMAGING: CBC    Component Value Date/Time   WBC 6.2 06/01/2022 1157   RBC 4.67 06/01/2022 1157   HGB 14.8 06/01/2022 1157   HCT 43.1 06/01/2022 1157   PLT 248.0 06/01/2022 1157   MCV 92.4 06/01/2022 1157   MCH 31.9 10/14/2021 0027   MCHC 34.3 06/01/2022 1157   RDW 13.4 06/01/2022 1157   LYMPHSABS 2.8 10/14/2021 0027   MONOABS 0.8 10/14/2021 0027   EOSABS 0.5 10/14/2021 0027   BASOSABS 0.0 10/14/2021 0027    CMP     Component Value Date/Time   NA 139 06/01/2022 1157   K 4.1 06/01/2022 1157   CL 100 06/01/2022 1157   CO2 31 06/01/2022 1157   GLUCOSE 99 06/01/2022 1157   BUN 17 06/01/2022 1157   CREATININE 0.98 06/01/2022 1157   CREATININE 1.05 04/19/2020 1046   CALCIUM 9.9 06/01/2022 1157   PROT 7.1 06/01/2022 1157   ALBUMIN 4.9 06/01/2022 1157   AST 30 06/01/2022 1157   ALT 45 06/01/2022 1157   ALKPHOS 78 06/01/2022 1157   BILITOT 0.9 06/01/2022 1157   GFRNONAA >60 10/14/2021 0027   ADDENDUM REPORT: 06/12/2022 11:09   EXAM: OVER-READ INTERPRETATION  CT CHEST   The following report is an over-read performed by radiologist Dr. Fonnie Birkenhead [facility] on 06/12/2022. This over-read does not include interpretation of cardiac or coronary anatomy or pathology. The interpretation by the cardiologist is attached.   COMPARISON:  None.   FINDINGS: Distal periesophageal and distal periaortic lymph nodes measure up to 1.4 cm adjacent to the distal esophagus, near the gastroesophageal junction (3/41). Scattered pulmonary parenchymal scarring. No suspicious pulmonary nodules.   IMPRESSION: Small to borderline enlarged distal periesophageal and distal periaortic lymph nodes are  indeterminate. A lymphoproliferative disorder or metastatic disease from a distal esophageal primary malignancy cannot be excluded. Consider CT chest with contrast in further evaluation. These results will be called to the ordering clinician or representative by the Radiologist Assistant, and communication documented in the PACS or [employer].     Electronically Signed   By: Lorin Picket M.D.   On: 06/12/2022 11:09        CT CHEST WITH CONTRAST   TECHNIQUE: Multidetector CT imaging of the chest was performed during intravenous contrast administration.   RADIATION DOSE REDUCTION: This exam was performed according to the departmental dose-optimization program which includes automated exposure control, adjustment  of the mA and/or kV according to patient size and/or use of iterative reconstruction technique.   CONTRAST:  135m OMNIPAQUE IOHEXOL 300 MG/ML  SOLN   COMPARISON:  06/09/2022   FINDINGS: Cardiovascular: Scattered aortic atherosclerosis. Normal heart size. Three-vessel coronary artery calcifications, better assessed by prior dedicated coronary calcium scoring examination. No pericardial effusion.   Mediastinum/Nodes: Similar appearance of prominent pretracheal, AP window, and paraesophageal lymph nodes measuring up to 1.4 x 1.0 cm (series 2, image 75). No overtly enlarged mediastinal, hilar, or axillary lymph nodes. Thyroid gland, trachea, and esophagus demonstrate no significant findings.   Lungs/Pleura: Lungs are clear. No pleural effusion or pneumothorax.   Upper Abdomen: No acute abnormality.  Hepatic steatosis.   Musculoskeletal: No chest wall abnormality. No acute osseous findings.   IMPRESSION: 1. Similar appearance of prominent pretracheal, AP window, and paraesophageal lymph nodes, which remain nonspecific. No overtly enlarged mediastinal, hilar, or axillary lymph nodes. Consider CT follow-up in 3-6 months. 2. No evidence of mass or other  abnormality of the esophagus. 3. Coronary artery disease. 4. Hepatic steatosis.   Aortic Atherosclerosis (ICD10-I70.0).     Electronically Signed   By: ADelanna AhmadiM.D.   On: 06/17/2022 13:33   ASSESSMENT/PLAN: 62year old male with a past medical history of adenomatous colon polyps, colonic diverticulosis, hypertension, fatty liver and migraine headaches who seen in consultation at the request of Dr. CLorelei Pontto evaluate abnormal CT of the chest with paraesophageal lymphadenopathy and to consider colonoscopy for surveillance.  Abnormal CT chest/paraesophageal and periaortic lymphadenopathy --nonspecific finding which we have discussed.  Certainly no GI alarm symptoms.  However I recommend that we perform upper endoscopy to exclude esophageal and proximal gastric pathology.  We reviewed the risk, benefits and alternatives and he is agreeable and wishes to proceed -- [place]-- If upper endoscopy negative consider repeat chest CT at the [facility] which would be April 2023  2.  History of adenomatous colon polyps --he had 2 adenomas, 1 = 1 cm in size in 2016.  Slightly overdue for surveillance.  I have recommended colonoscopy which we can perform on the same day as his upper endoscopy.  We reviewed the risk, benefits and alternatives and he is agreeable and wishes to proceed [place]  CcBU:LAGTXMIJeGay FillerMdPorteriGruver0Rainelle [place] [id number]

## 2022-10-10 ENCOUNTER — Telehealth: Payer: Self-pay | Admitting: Family Medicine

## 2022-10-10 ENCOUNTER — Encounter: Payer: Self-pay | Admitting: Family Medicine

## 2022-10-10 DIAGNOSIS — Z125 Encounter for screening for malignant neoplasm of prostate: Secondary | ICD-10-CM

## 2022-10-10 DIAGNOSIS — I10 Essential (primary) hypertension: Secondary | ICD-10-CM

## 2022-10-10 DIAGNOSIS — E785 Hyperlipidemia, unspecified: Secondary | ICD-10-CM

## 2022-10-10 NOTE — Telephone Encounter (Signed)
Pt's wife, [name], said she has been corresponding with Dr. Lorelei Pont and Dr. Lorelei Pont wanted pt to be scheduled with lab for his cholesterol on the same day she's coming which is 10/16/22. Due to them being in [place] with a 5 hour time difference, appt was scheduled. Advised I would request lab order to be placed for him.

## 2022-10-16 ENCOUNTER — Other Ambulatory Visit (INDEPENDENT_AMBULATORY_CARE_PROVIDER_SITE_OTHER): Payer: BC Managed Care – PPO

## 2022-10-16 ENCOUNTER — Encounter: Payer: Self-pay | Admitting: Internal Medicine

## 2022-10-16 DIAGNOSIS — E785 Hyperlipidemia, unspecified: Secondary | ICD-10-CM

## 2022-10-16 DIAGNOSIS — I10 Essential (primary) hypertension: Secondary | ICD-10-CM | POA: Diagnosis not present

## 2022-10-16 DIAGNOSIS — Z125 Encounter for screening for malignant neoplasm of prostate: Secondary | ICD-10-CM

## 2022-10-16 LAB — CBC
HCT: 43.9 % (ref 39.0–52.0)
Hemoglobin: 15.4 g/dL (ref 13.0–17.0)
MCHC: 35.2 g/dL (ref 30.0–36.0)
MCV: 90.9 fl (ref 78.0–100.0)
Platelets: 264 10*3/uL (ref 150.0–400.0)
RBC: 4.83 Mil/uL (ref 4.22–5.81)
RDW: 13.5 % (ref 11.5–15.5)
WBC: 7.9 10*3/uL (ref 4.0–10.5)

## 2022-10-16 LAB — BASIC METABOLIC PANEL
BUN: 14 mg/dL (ref 6–23)
CO2: 28 mEq/L (ref 19–32)
Calcium: 9.7 mg/dL (ref 8.4–10.5)
Chloride: 100 mEq/L (ref 96–112)
Creatinine, Ser: 1.09 mg/dL (ref 0.40–1.50)
GFR: 72.35 mL/min (ref >60.00)
Glucose, Bld: 122 mg/dL — ABNORMAL HIGH (ref 70–99)
Potassium: 3.6 mEq/L (ref 3.5–5.1)
Sodium: 140 mEq/L (ref 135–145)

## 2022-10-16 LAB — LIPID PANEL
Cholesterol: 98 mg/dL (ref 0–200)
HDL: 38.1 mg/dL — ABNORMAL LOW (ref >39.00)
LDL Cholesterol: 43 mg/dL (ref 0–99)
NonHDL: 60.3
Total CHOL/HDL Ratio: 3
Triglycerides: 86 mg/dL (ref 0.0–149.0)
VLDL: 17.2 mg/dL (ref 0.0–40.0)

## 2022-10-16 LAB — PSA: PSA: 3.28 ng/mL (ref 0.10–4.00)

## 2022-10-17 ENCOUNTER — Encounter: Payer: BC Managed Care – PPO | Admitting: Internal Medicine

## 2022-10-17 ENCOUNTER — Encounter: Payer: Self-pay | Admitting: Family Medicine

## 2022-10-23 ENCOUNTER — Ambulatory Visit (AMBULATORY_SURGERY_CENTER): Payer: BC Managed Care – PPO | Admitting: Internal Medicine

## 2022-10-23 ENCOUNTER — Encounter: Payer: Self-pay | Admitting: Internal Medicine

## 2022-10-23 VITALS — BP 111/66 | HR 47 | Temp 98.3°F | Resp 13 | Ht 70.0 in | Wt 237.0 lb

## 2022-10-23 DIAGNOSIS — Z09 Encounter for follow-up examination after completed treatment for conditions other than malignant neoplasm: Secondary | ICD-10-CM

## 2022-10-23 DIAGNOSIS — D120 Benign neoplasm of cecum: Secondary | ICD-10-CM | POA: Diagnosis not present

## 2022-10-23 DIAGNOSIS — D124 Benign neoplasm of descending colon: Secondary | ICD-10-CM

## 2022-10-23 DIAGNOSIS — D128 Benign neoplasm of rectum: Secondary | ICD-10-CM | POA: Diagnosis not present

## 2022-10-23 DIAGNOSIS — Z86010 Personal history of colonic polyps: Secondary | ICD-10-CM

## 2022-10-23 DIAGNOSIS — D123 Benign neoplasm of transverse colon: Secondary | ICD-10-CM | POA: Diagnosis not present

## 2022-10-23 DIAGNOSIS — K2970 Gastritis, unspecified, without bleeding: Secondary | ICD-10-CM

## 2022-10-23 DIAGNOSIS — R9389 Abnormal findings on diagnostic imaging of other specified body structures: Secondary | ICD-10-CM

## 2022-10-23 MED ORDER — SODIUM CHLORIDE 0.9 % IV SOLN: 500.0000 mL | Freq: Once | INTRAVENOUS | Status: DC

## 2022-10-23 NOTE — Progress Notes (Unsigned)
GASTROENTEROLOGY PROCEDURE H&P NOTE   Primary Care Physician: Darreld Mclean, MD    Reason for Procedure:  Abnormal CT chest/paraesophageal lymphadenopathy and history of adenomatous colon polyps  Plan:    EGD colonoscopy  Patient is appropriate for endoscopic procedure(s) in the ambulatory ([place]) setting.  The nature of the procedure, as well as the risks, benefits, and alternatives were carefully and thoroughly reviewed with the patient. Ample time for discussion and questions allowed. The patient understood, was satisfied, and agreed to proceed.     HPI: Henry Wallace is a 62 y.o. male who presents for EGD and colonoscopy.  Medical history as below.  Tolerated the prep.  No recent chest pain or shortness of breath.  No abdominal pain today.  Past Medical History:  Diagnosis Date   Adenomatous colon polyp    Coronary artery disease    Diverticulosis    Hepatic steatosis    Hypertension    Migraines     Past Surgical History:  Procedure Laterality Date   ADENOIDECTOMY     ANKLE SURGERY Left 1994   HERNIA REPAIR     PROSTATE SURGERY     VASECTOMY      Prior to Admission medications   Medication Sig Start Date End Date Taking? Authorizing Provider  amLODipine (NORVASC) 10 MG tablet TAKE 1 TABLET(10 MG) BY MOUTH DAILY 06/01/22  Yes Copland, Gay Filler, MD  aspirin 81 MG chewable tablet Chew by mouth daily.   Yes [provider]  citalopram (CELEXA) 20 MG tablet TAKE 1 TABLET(20 MG) BY MOUTH DAILY 06/01/22  Yes Copland, Gay Filler, MD  hydrochlorothiazide (HYDRODIURIL) 25 MG tablet Take 1 tablet (25 mg total) by mouth daily. 06/01/22  Yes Copland, Gay Filler, MD  OVER THE COUNTER MEDICATION Vitamin D 3 5000 mg taking daily   Yes [provider]  rosuvastatin (CRESTOR) 10 MG tablet TAKE 1 TABLET(10 MG) BY MOUTH DAILY 06/12/22  Yes Copland, Gay Filler, MD  5-Hydroxytryptophan (5-HTP) 100 MG CAPS Take by mouth.    [provider]   multivitamin-iron-minerals-folic acid (CENTRUM) chewable tablet Chew 1 tablet by mouth daily.    [provider]    Current Outpatient Medications  Medication Sig Dispense Refill   amLODipine (NORVASC) 10 MG tablet TAKE 1 TABLET(10 MG) BY MOUTH DAILY 90 tablet 3   aspirin 81 MG chewable tablet Chew by mouth daily.     citalopram (CELEXA) 20 MG tablet TAKE 1 TABLET(20 MG) BY MOUTH DAILY 90 tablet 3   hydrochlorothiazide (HYDRODIURIL) 25 MG tablet Take 1 tablet (25 mg total) by mouth daily. 90 tablet 3   OVER THE COUNTER MEDICATION Vitamin D 3 5000 mg taking daily     rosuvastatin (CRESTOR) 10 MG tablet TAKE 1 TABLET(10 MG) BY MOUTH DAILY 90 tablet 3   5-Hydroxytryptophan (5-HTP) 100 MG CAPS Take by mouth.     multivitamin-iron-minerals-folic acid (CENTRUM) chewable tablet Chew 1 tablet by mouth daily.     Current Facility-Administered Medications  Medication Dose Route Frequency Provider Last Rate Last Admin   0.9 %  sodium chloride infusion  500 mL Intravenous Once Araya Roel, Lajuan Lines, MD        Allergies as of 10/23/2022   (No Known Allergies)    Family History  Problem Relation Age of Onset   Diverticulitis Mother    Diabetes Maternal Grandmother    Lung cancer Maternal Grandfather    Heart disease Paternal Grandfather    Colon cancer Neg Hx  Stomach cancer Neg Hx    Esophageal cancer Neg Hx     Social History   Socioeconomic History   Marital status: Married    Spouse name: Not on file   Number of children: 1   Years of education: Not on file   Highest education level: Not on file  Occupational History   Occupation: Optician, dispensing  Tobacco Use   Smoking status: Former    Types: Cigarettes   Smokeless tobacco: Never  Substance and Sexual Activity   Alcohol use: Yes    Comment: liquor   Drug use: No   Sexual activity: Yes  Other Topics Concern   Not on file  Social History Narrative   Married. Education: [employer].    Social Determinants of Health    Financial Resource Strain: Not on file  Food Insecurity: Not on file  Transportation Needs: Not on file  Physical Activity: Not on file  Stress: Not on file  Social Connections: Not on file  Intimate Partner Violence: Not on file    Physical Exam: Vital signs in last 24 hours: '@BP'$  (!) 145/74   Pulse (!) 57   Temp 98.3 F (36.8 C)   Ht '5\' 10"'$  (1.778 m)   Wt 237 lb (107.5 kg)   SpO2 96%   BMI 34.01 kg/m  GEN: NAD EYE: Sclerae anicteric ENT: MMM CV: Non-tachycardic Pulm: CTA b/l GI: Soft, NT/ND NEURO:  Alert & Oriented x 3   Zenovia Jarred, MD [facility]  10/23/2022 2:47 PM

## 2022-10-23 NOTE — Op Note (Signed)
[place] [facility] Patient Name: Henry Wallace Procedure Date: 10/23/2022 2:52 PM MRN: [id number] Endoscopist: Jerene Bears , MD, [id number] Age: 62 Referring MD:  Date of Birth: May 25, 1961 Gender: Male Account #: [id number] Procedure:                Colonoscopy Indications:              High risk colon cancer surveillance: Personal                            history of adenoma (10 mm or greater in size), Last                            colonoscopy: 2019 Medicines:                Monitored Anesthesia Care Procedure:                Pre-Anesthesia Assessment:                           - Prior to the procedure, a History and Physical                            was performed, and patient medications and                            allergies were reviewed. The patient's tolerance of                            previous anesthesia was also reviewed. The risks                            and benefits of the procedure and the sedation                            options and risks were discussed with the patient.                            All questions were answered, and informed consent                            was obtained. Prior Anticoagulants: The patient has                            taken no anticoagulant or antiplatelet agents. ASA                            Grade Assessment: III - A patient with severe                            systemic disease. After reviewing the risks and                            benefits, the patient was deemed in satisfactory  condition to undergo the procedure.                           After obtaining informed consent, the colonoscope                            was passed under direct vision. Throughout the                            procedure, the patient's blood pressure, pulse, and                            oxygen saturations were monitored continuously. The                            Olympus CF-HQ190L [id number]) Colonoscope  was                            introduced through the anus and advanced to the                            cecum, identified by appendiceal orifice and                            ileocecal valve. The colonoscopy was performed                            without difficulty. The patient tolerated the                            procedure well. The quality of the bowel                            preparation was good. The ileocecal valve,                            appendiceal orifice, and rectum were photographed. Scope In: 3:06:43 PM Scope Out: 3:31:52 PM Scope Withdrawal Time: 0 hours 19 minutes 25 seconds  Total Procedure Duration: 0 hours 25 minutes 9 seconds  Findings:                 The digital rectal exam was normal.                           Two sessile polyps were found in the cecum. The                            polyps were 3 to 5 mm in size. These polyps were                            removed with a cold snare. Resection and retrieval                            were complete.  Three sessile polyps were found in the transverse                            colon. The polyps were 4 to 8 mm in size. These                            polyps were removed with a cold snare. Resection                            and retrieval were complete.                           A 5 mm polyp was found in the descending colon. The                            polyp was sessile. The polyp was removed with a                            cold snare. Resection and retrieval were complete.                           A 2 mm polyp was found in the rectum. The polyp was                            sessile. The polyp was removed with a cold snare.                            Resection and retrieval were complete.                           Multiple medium-mouthed and small-mouthed                            diverticula were found in the sigmoid colon.                           Internal hemorrhoids  were found during                            retroflexion. The hemorrhoids were medium-sized. Complications:            No immediate complications. Estimated Blood Loss:     Estimated blood loss was minimal. Impression:               - Two 3 to 5 mm polyps in the cecum, removed with a                            cold snare. Resected and retrieved.                           - Three 4 to 8 mm polyps in the transverse colon,  removed with a cold snare. Resected and retrieved.                           - One 5 mm polyp in the descending colon, removed                            with a cold snare. Resected and retrieved.                           - One 2 mm polyp in the rectum, removed with a cold                            snare. Resected and retrieved.                           - Mild diverticulosis in the sigmoid colon.                           - Internal hemorrhoids. Recommendation:           - Patient has a contact number available for                            emergencies. The signs and symptoms of potential                            delayed complications were discussed with the                            patient. Return to normal activities tomorrow.                            Written discharge instructions were provided to the                            patient.                           - Resume previous diet.                           - Continue present medications.                           - Await pathology results.                           - Repeat colonoscopy is recommended for                            surveillance. The colonoscopy date will be                            determined after pathology results from today's  exam become available for review. Jerene Bears, MD 10/23/2022 3:39:14 PM This report has been signed electronically.

## 2022-10-23 NOTE — Op Note (Signed)
[place] Patient Name: Mecca Puff Procedure Date: 10/23/2022 2:52 PM MRN: [id number] Endoscopist: Jerene Bears , MD, [id number] Age: 63 Referring MD:  Date of Birth: [birth year] Gender: Male Account #: [id number] Procedure:                Upper GI endoscopy Indications:              Abnormal CT of the GI tract (periesophageal                            lymphadenopathy) Medicines:                Monitored Anesthesia Care Procedure:                Pre-Anesthesia Assessment:                           - Prior to the procedure, a History and Physical                            was performed, and patient medications and                            allergies were reviewed. The patient's tolerance of                            previous anesthesia was also reviewed. The risks                            and benefits of the procedure and the sedation                            options and risks were discussed with the patient.                            All questions were answered, and informed consent                            was obtained. Prior Anticoagulants: The patient has                            taken no anticoagulant or antiplatelet agents. ASA                            Grade Assessment: III - A patient with severe                            systemic disease. After reviewing the risks and                            benefits, the patient was deemed in satisfactory                            condition to undergo the procedure.  After obtaining informed consent, the endoscope was                            passed under direct vision. Throughout the                            procedure, the patient's blood pressure, pulse, and                            oxygen saturations were monitored continuously. The                            GIF [id number] [id number] was introduced through the                            mouth, and advanced to the second part of  duodenum.                            The upper GI endoscopy was accomplished without                            difficulty. The patient tolerated the procedure                            well. Scope In: Scope Out: Findings:                 LA Grade A (one or more mucosal breaks less than 5                            mm, not extending between tops of 2 mucosal folds)                            esophagitis with no bleeding was found at the                            gastroesophageal junction.                           The exam of the esophagus was otherwise normal.                           Patchy moderate inflammation characterized by                            congestion (edema), erosions and erythema was found                            in the gastric antrum. Biopsies were taken with a                            cold forceps for histology and Helicobacter pylori  testing.                           The cardia and gastric fundus were normal on                            retroflexion.                           The examined duodenum was normal. Complications:            No immediate complications. Estimated Blood Loss:     Estimated blood loss was minimal. Impression:               - LA Grade A reflux esophagitis with no bleeding.                           - Gastritis. Biopsied.                           - Normal examined duodenum.                           - No evidence of UGI tract malignancy. Recommendation:           - Patient has a contact number available for                            emergencies. The signs and symptoms of potential                            delayed complications were discussed with the                            patient. Return to normal activities tomorrow.                            Written discharge instructions were provided to the                            patient.                           - Resume previous diet.                            - Continue present medications. If heartburn or                            GERD symptoms OTC omeprazole 20 mg daily can be                            used x 2-4 weeks or famotidine 20 mg can be used                            twice daily as needed.                           -  Await pathology results.                           - See the other procedure note for documentation of                            additional recommendations. Jerene Bears, MD 10/23/2022 3:36:39 PM This report has been signed electronically.

## 2022-10-23 NOTE — Patient Instructions (Signed)
HANDOUTS PROVIDED ON: ESOPHAGITIS, GASTRITIS, POLYPS, DIVERTICULOSIS, & HEMORRHOIDS  The polyps removed/biopsies taken today have been sent for pathology.  The results can take 1-3 weeks to receive.  When your next colonoscopy should occur will be based on the pathology results.    You may resume your previous diet and medication schedule.  If heartburn or GERD symptoms consistently occur you can take either: omeprazole (Prilosec) 20 mg taken daily for 2 to 4 weeks OR famotadine (Pepcid) 20 mg can be taken twice daily as needed.  Both of these medications can be purchased OTC and are best taken 30 minutes before a meal.  Thank you for allowing [place] to care for you today!!!  YOU HAD AN ENDOSCOPIC PROCEDURE TODAY AT [facility]:   Refer to the procedure report that was given to you for any specific questions about what was found during the examination.  If the procedure report does not answer your questions, please call your gastroenterologist to clarify.  If you requested that your care partner not be given the details of your procedure findings, then the procedure report has been included in a sealed envelope for you to review at your convenience later.  YOU SHOULD EXPECT: Some feelings of bloating in the abdomen. Passage of more gas than usual.  Walking can help get rid of the air that was put into your GI tract during the procedure and reduce the bloating. If you had a lower endoscopy (such as a colonoscopy or flexible sigmoidoscopy) you may notice spotting of blood in your stool or on the toilet paper. If you underwent a bowel prep for your procedure, you may not have a normal bowel movement for a few days.  Please Note:  You might notice some irritation and congestion in your nose or some drainage.  This is from the oxygen used during your procedure.  There is no need for concern and it should clear up in a day or so.  SYMPTOMS TO REPORT IMMEDIATELY:  Following lower endoscopy  (colonoscopy or flexible sigmoidoscopy):  Excessive amounts of blood in the stool  Significant tenderness or worsening of abdominal pains  Swelling of the abdomen that is new, acute  Fever of 100F or higher  Following upper endoscopy (EGD)  Vomiting of blood or coffee ground material  New chest pain or pain under the shoulder blades  Painful or persistently difficult swallowing  New shortness of breath  Fever of 100F or higher  Black, tarry-looking stools  For urgent or emergent issues, a gastroenterologist can be reached at any hour by calling [phone]. Do not use MyChart messaging for urgent concerns.    DIET:  We do recommend a small meal at first, but then you may proceed to your regular diet.  Drink plenty of fluids but you should avoid alcoholic beverages for 24 hours.  ACTIVITY:  You should plan to take it easy for the rest of today and you should NOT DRIVE or use heavy machinery until tomorrow (because of the sedation medicines used during the test).    FOLLOW UP: Our staff will call the number listed on your records the next business day following your procedure.  We will call around 7:15- 8:00 am to check on you and address any questions or concerns that you may have regarding the information given to you following your procedure. If we do not reach you, we will leave a message.     If any biopsies were taken you will be  contacted by phone or by letter within the next 1-3 weeks.  Please call us at [phone] if you have not heard about the biopsies in 3 weeks.    SIGNATURES/CONFIDENTIALITY: You and/or your care partner have signed paperwork which will be entered into your electronic medical record.  These signatures attest to the fact that that the information above on your After Visit Summary has been reviewed and is understood.  Full responsibility of the confidentiality of this discharge information lies with you and/or your care-partner.

## 2022-10-23 NOTE — Progress Notes (Signed)
Pt's states no medical or surgical changes since previsit or office visit. 

## 2022-10-23 NOTE — Progress Notes (Unsigned)
Called to room to assist during endoscopic procedure.  Patient ID and intended procedure confirmed with present staff. Received instructions for my participation in the procedure from the performing physician.  

## 2022-10-23 NOTE — Progress Notes (Unsigned)
Vss nad trans to pacu °

## 2022-10-24 ENCOUNTER — Telehealth: Payer: Self-pay | Admitting: *Deleted

## 2022-10-24 NOTE — Telephone Encounter (Signed)
Post procedure follow up call placed, no answer and left VM.  

## 2022-10-26 ENCOUNTER — Encounter: Payer: Self-pay | Admitting: Internal Medicine

## 2022-10-26 DIAGNOSIS — L738 Other specified follicular disorders: Secondary | ICD-10-CM | POA: Diagnosis not present

## 2022-10-26 DIAGNOSIS — L578 Other skin changes due to chronic exposure to nonionizing radiation: Secondary | ICD-10-CM | POA: Diagnosis not present

## 2022-10-26 DIAGNOSIS — L858 Other specified epidermal thickening: Secondary | ICD-10-CM | POA: Diagnosis not present

## 2022-10-26 DIAGNOSIS — L812 Freckles: Secondary | ICD-10-CM | POA: Diagnosis not present

## 2023-01-15 IMAGING — CT CT HEAD W/O CM
2 of 7 series · 15 of 47 positions shown, 18 images · non-contrast
Comparison: 03/17/2007

CLINICAL DATA: Right vision loss



[Series 4: head 2.0 h70h · axial · 0.47mm/px · z∈[-91,+37]mm · 12 of 73 slices shown, 15 images]
[im 5/73  brain]
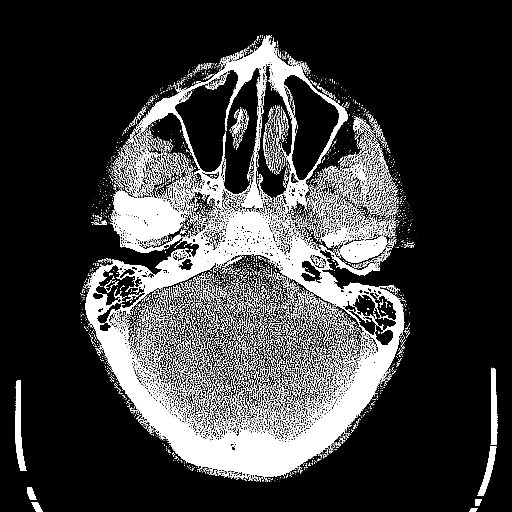
[im 5/73  bone]
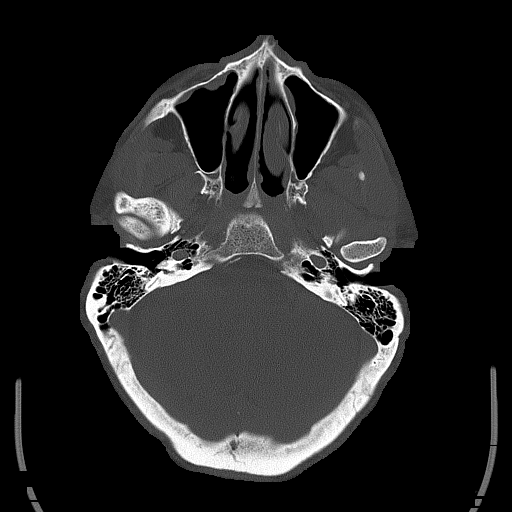
[im 13/73  brain]
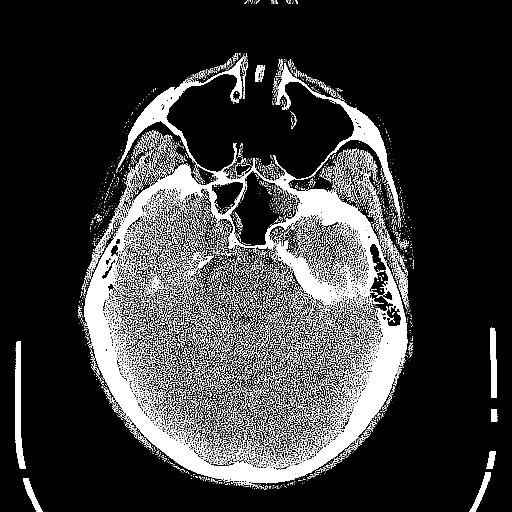
[im 17/73  brain]
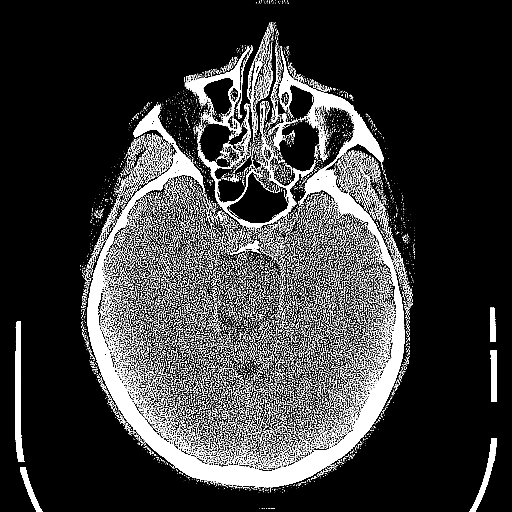
[im 21/73  brain]
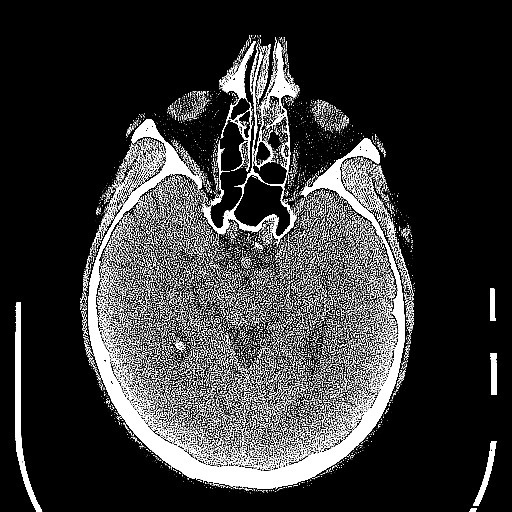
[im 29/73  brain]
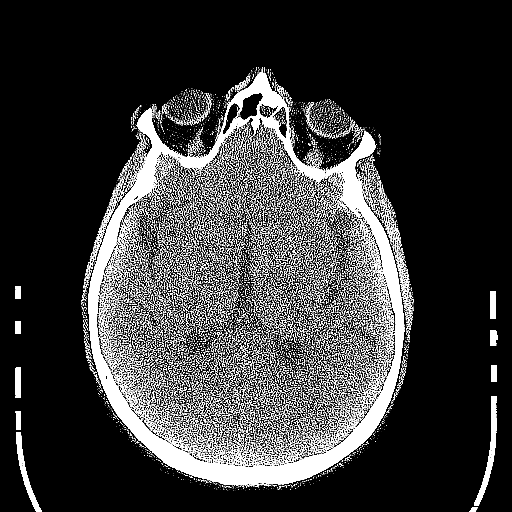
[im 29/73  bone]
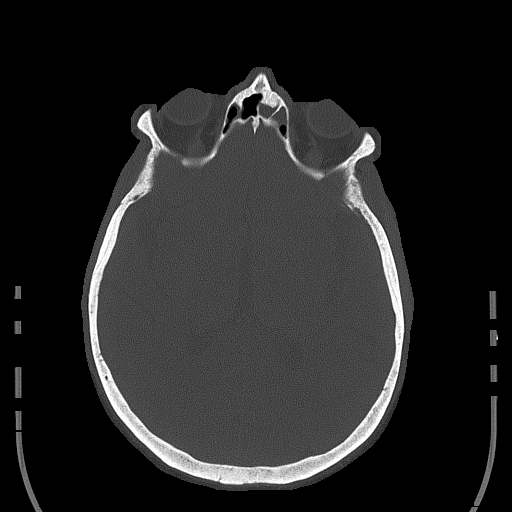
[im 33/73  brain]
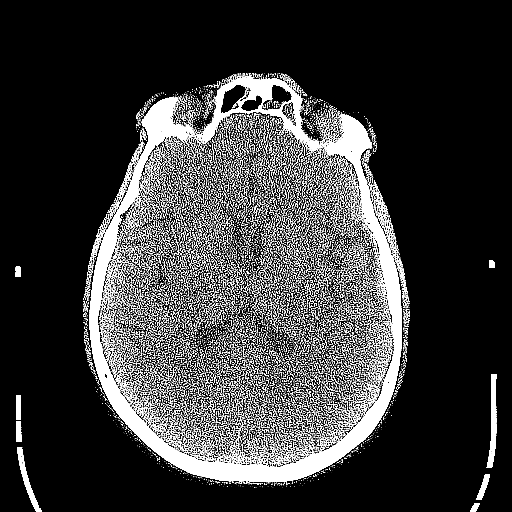
[im 41/73  brain]
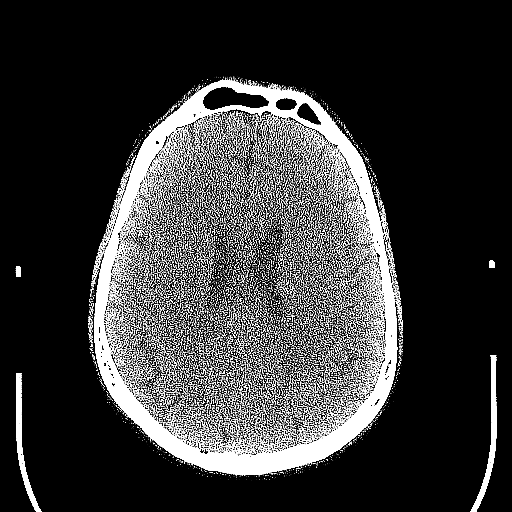
[im 45/73  brain]
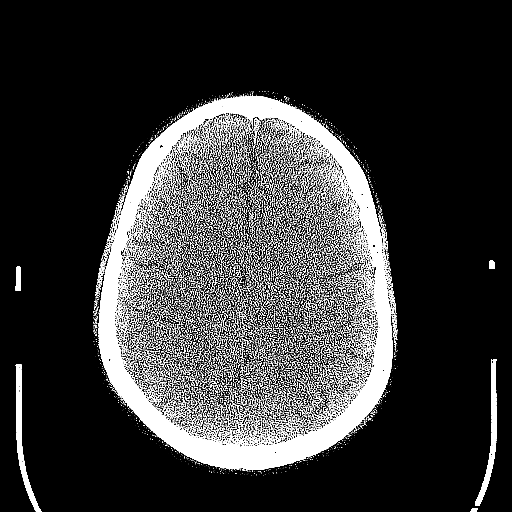
[im 53/73  brain]
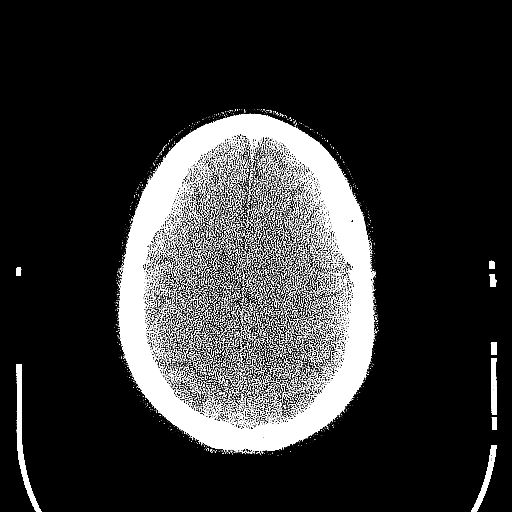
[im 53/73  bone]
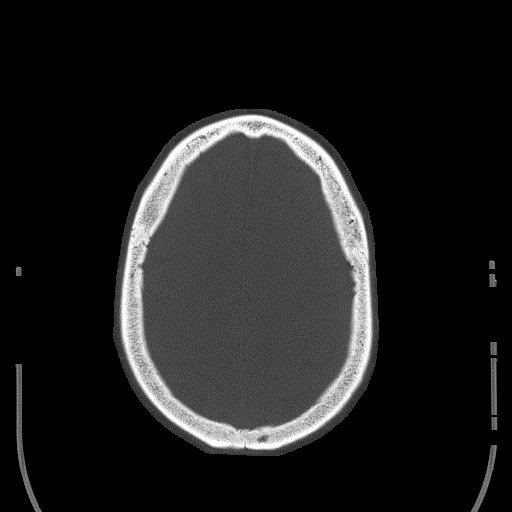
[im 57/73  brain]
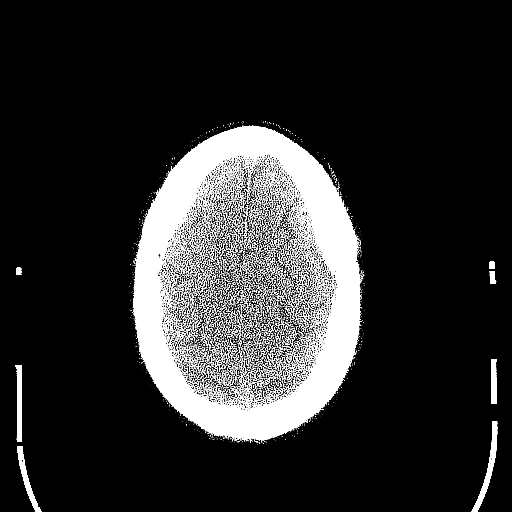
[im 61/73  brain]
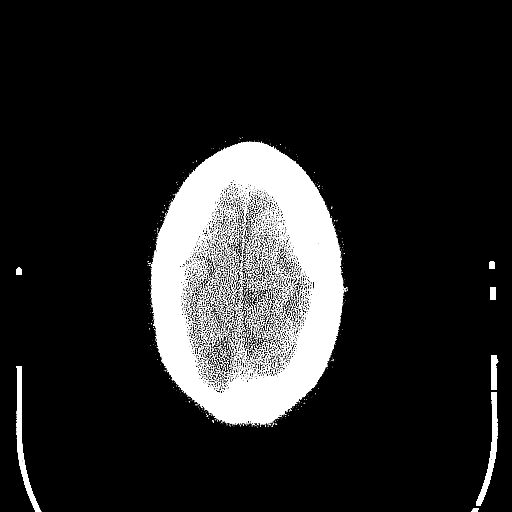
[im 69/73  brain]
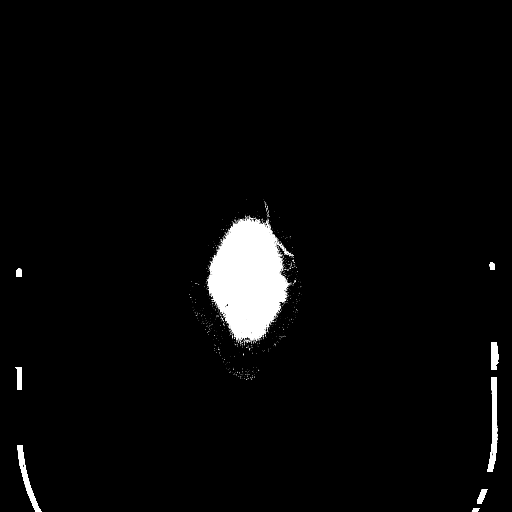

[Series 9: head 3.0 mpr cor · coronal · 0.30mm/px · 3 of 75 slices shown]
[im 19/75  brain]
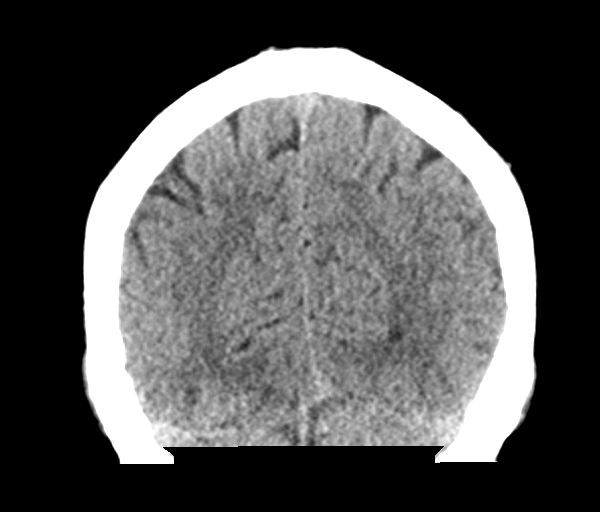
[im 38/75  brain]
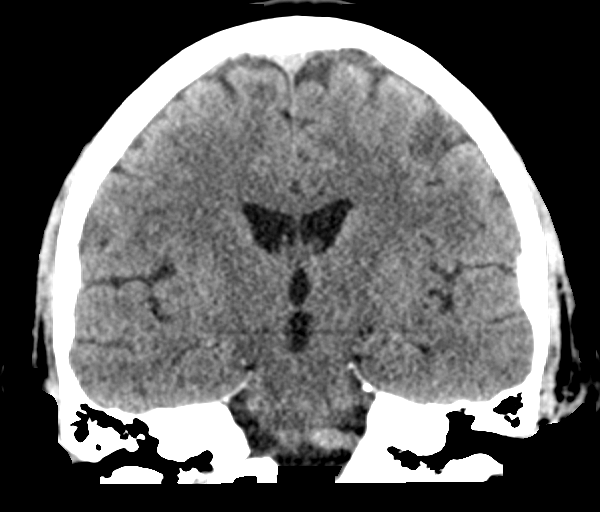
[im 56/75  brain]
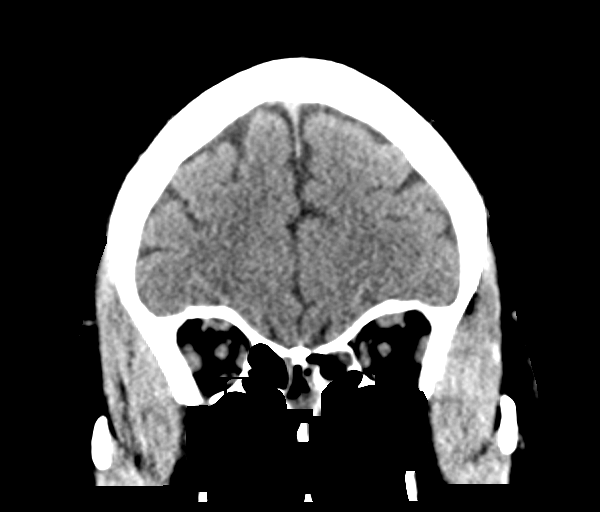

[15 of 47 positions shown; findings below may reference images not displayed]

FINDINGS: Brain: No evidence of acute infarction, hemorrhage, hydrocephalus,
extra-axial collection or mass lesion/mass effect.

Vascular: No hyperdense vessel or unexpected calcification.

Skull: Normal. Negative for fracture or focal lesion.

Sinuses/Orbits: Mild partial opacification of the paranasal sinuses.
Mastoid air cells are clear.

Other: None.
IMPRESSION: Normal head CT.

## 2023-02-26 NOTE — Patient Instructions (Incomplete)
It was good to see you again today!   We will start on zepbound 2.5 mg weekly- can go up to 5 mg after a month and go up from there as needed and tolerated Let's increase your citalopram to 40 mg  Please keep me posted on how you are doing- I will check on your CT scan and see if you need contrast or not, will get it ordered for you

## 2023-02-26 NOTE — Progress Notes (Unsigned)
[facility] [address] [place], [place] [address] [phone]  Date:  02/28/2023   Name:  Henry Wallace   DOB:  02-Nov-1960   MRN:  [id number]  PCP:  Pearline Cables, MD    Chief Complaint: fatigue/ Depression (He is overall feeling tired. Pt says he is also having troubles with losing weight.  He is under a lot of stress. )   History of Present Illness:  Henry Wallace is a 62 y.o. very pleasant male patient who presents with the following:  Patient seen today with concern of fatigue Most recent visit with myself was in October for his physical exam History of hypertension, dyslipidemia, episode of ocular migraine in February of 2023  Notes from our last visit: Most recent visit with myself was in May of this year-at that time patient and his wife were concerned about low energy, fatigue, loss of muscle mass, decreased sex drive. We checked his testosterone and it was in normal range for age at 400-I double checked with endocrinology, agreed to that testosterone replacement not appropriate at this time I suggested proceeding to neurology evaluation, perhaps change Celexa to Wellbutrin but as of yet he did not wish to do anything further. 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  He has not been exercising and has a goal of improving his exercise routine. He notes when they did a lot of walking on a recent trip to [place] however he did fine, no chest pain  I do not believe he has done a sleep study as of yet-this is something we might consider  CT coronary calcium completed last October-this did lead to a follow-up chest CT for mediastinal lymphadenopathy Date 06/15/22 IMPRESSION: 1. Similar appearance of prominent pretracheal, AP window, and paraesophageal lymph nodes, which remain nonspecific. No overtly enlarged  mediastinal, hilar, or axillary lymph nodes. Consider CT follow-up in 3-6 months. 2. No evidence of mass or other abnormality of the esophagus. 3. Coronary artery disease. 4. Hepatic steatosis.   He had a rather high coronary calcium score, 91st percentile.  He followed up with Dr. Nanetta Batty in November: HYPERTENSION, BENIGN History of essential hypertension a blood pressure measured today at 129/81.  He is on amlodipine, and hydrochlorothiazide. Hyperlipidemia History of hyperlipidemia on rosuvastatin with recent lipid profile performed 06/01/2022 revealing total cholesterol 113, LDL 47 and HDL 34. Elevated coronary artery calcium score Coronary calcium score performed 06/09/2022 was 610 with calcium distributed in all 3 coronary arteries.  He is very active and completely asymptomatic.  He is on statin therapy with an LDL of 47, at goal for secondary prevention.  He is also on a baby aspirin.   I also had him see GI, Dr. Rhea Belton for follow-up of paraesophageal lymphadenopathy: ASSESSMENT/PLAN: 62 year old male with a past medical history of adenomatous colon polyps, colonic diverticulosis, hypertension, fatty liver and migraine headaches who seen in consultation at the request of Dr. Patsy Lager to evaluate abnormal CT of the chest with paraesophageal lymphadenopathy and to consider colonoscopy for surveillance. Abnormal CT chest/paraesophageal and periaortic lymphadenopathy --nonspecific finding which we have discussed.  Certainly no GI alarm symptoms.  However I recommend that we perform upper endoscopy to exclude esophageal and proximal gastric pathology.  We reviewed the risk, benefits and alternatives and he is agreeable and wishes to  proceed -- EGD in the [facility] -- If upper endoscopy negative consider repeat chest CT at the 64-month mark which would be April 2023 2.  History of adenomatous colon polyps --he had 2 adenomas, 1 = 1 cm in size in 2016.  Slightly overdue for surveillance.  I have  recommended colonoscopy which we can perform on the same day as his upper endoscopy.  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  He notes he is feeling more irritable, has less energy, is having difficulty losing weight.  He will try to exercise and eat right, but then may binge on unhealthy foods after the time of deprivation  We discussed starting a GLP-1, he would be interested in doing this.  He has no contraindication Patient Active Problem List   Diagnosis Date Noted   Hyperlipidemia 07/05/2022   Elevated coronary artery calcium score 07/05/2022   Carotid stenosis, asymptomatic, bilateral 10/18/2021   MDD (major depressive disorder), recurrent, in full remission (HCC) 11/04/2015   OBESITY, UNSPECIFIED 07/06/2009   HYPERTENSION, BENIGN 07/06/2009   CHEST PAIN, PRECORDIAL 07/06/2009    Past Medical History:  Diagnosis Date   Adenomatous colon polyp    Coronary artery disease    Diverticulosis    Hepatic steatosis    Hypertension    Migraines     Past Surgical History:  Procedure Laterality Date   ADENOIDECTOMY     ANKLE SURGERY Left 1994   HERNIA REPAIR     PROSTATE SURGERY     VASECTOMY      Social History   Tobacco Use   Smoking status: Former    Types: Cigarettes   Smokeless tobacco: Never  Substance Use Topics   Alcohol use: Yes    Comment: liquor   Drug use: No    Family History  Problem Relation Age of Onset   Diverticulitis Mother    Diabetes Maternal Grandmother    Lung cancer Maternal Grandfather    Heart disease Paternal Grandfather     Colon cancer Neg Hx    Stomach cancer Neg Hx    Esophageal cancer Neg Hx     No Known Allergies  Medication list has been reviewed and updated.  Current Outpatient Medications on File Prior to Visit  Medication Sig Dispense Refill   5-Hydroxytryptophan (5-HTP) 100 MG CAPS Take by mouth.     amLODipine (NORVASC) 10 MG tablet TAKE 1 TABLET(10 MG) BY MOUTH DAILY 90 tablet 3   aspirin 81 MG chewable tablet Chew by mouth daily.     hydrochlorothiazide (HYDRODIURIL) 25 MG tablet Take 1 tablet (25 mg total) by mouth daily. 90 tablet 3   multivitamin-iron-minerals-folic acid (CENTRUM) chewable tablet Chew 1 tablet by mouth daily.     OVER THE COUNTER MEDICATION Vitamin D 3 5000 mg taking daily     rosuvastatin (CRESTOR) 10 MG tablet TAKE 1 TABLET(10 MG) BY MOUTH DAILY 90 tablet 3   No current facility-administered medications on file prior to visit.    Review of Systems:  As per HPI- otherwise  negative.   Physical Examination: Vitals:   02/28/23 0938  BP: 136/82  Pulse: 67  Resp: 18  Temp: 97.6 F (36.4 C)  SpO2: 95%   Vitals:   02/28/23 0938  Weight: 236 lb 6.4 oz (107.2 kg)  Height: 5\' 9"  (1.753 m)   Body mass index is 34.91 kg/m. Ideal Body Weight: Weight in (lb) to have BMI = 25: 168.9  GEN: no acute distress.  Obese/stocky build.  Looks well HEENT: Atraumatic, Normocephalic.  Ears and Nose: No external deformity. CV: RRR, No M/G/R. No JVD. No thrill. No extra heart sounds. PULM: CTA B, no wheezes, crackles, rhonchi. No retractions. No resp. distress. No accessory muscle use. ABD: S, NT, ND EXTR: No c/c/e PSYCH: Normally interactive. Conversant.    Wt Readings from Last 3 Encounters:  02/28/23 236 lb 6.4 oz (107.2 kg)  10/23/22 237 lb (107.5 kg)  09/13/22 237 lb 9.6 oz (107.8 kg)  Weight one year ago 233  Assessment and Plan: Class 1 obesity with serious comorbidity and body mass index (BMI) of 34.0 to 34.9 in adult, unspecified obesity type - Plan:  tirzepatide (ZEPBOUND) 2.5 MG/0.5ML Pen  Increased prostate specific antigen (PSA) velocity - Plan: PSA  Essential hypertension - Plan: Basic metabolic panel  Major depressive disorder with single episode, in remission (HCC) - Plan: citalopram (CELEXA) 40 MG tablet  Mediastinal lymphadenopathy - Plan: CT Chest W Contrast  Patient seen today for follow-up.  He has obesity, hypertension.  Difficulty losing weight.  He would like to try a GLP-1.  Prescribe set down 2.5 mg, discussed how to use this medication, titration process, most common side effects.  He is also advised his insurance may not cover this drug.  He will keep me posted how it goes getting this filled  Follow-up on PSA, was mildly elevated above baseline at last check  Blood pressure under good control  Henry Wallace is taking 20 mg of Celexa currently-he would be interested in increasing this to 40.  Made this change for him today Consulted with radiology, they recommend a CT chest with contrast to follow-up nodes mentioned above Signed Abbe Amsterdam, MD  Received labs as below, message to patient Results for orders placed or performed in visit on 02/28/23  Basic metabolic panel  Result Value Ref Range   Sodium 139 135 - 145 mEq/L   Potassium 3.7 3.5 - 5.1 mEq/L   Chloride 99 96 - 112 mEq/L   CO2 33 (H) 19 - 32 mEq/L   Glucose, Bld 121 (H) 70 - 99 mg/dL   BUN 17 6 - 23 mg/dL   Creatinine, Ser 1.61 0.40 - 1.50 mg/dL   GFR 09.60 >45.40 mL/min   Calcium 9.9 8.4 - 10.5 mg/dL  PSA  Result Value Ref Range   PSA 4.41 (H) 0.10 - 4.00 ng/mL

## 2023-02-28 ENCOUNTER — Ambulatory Visit (INDEPENDENT_AMBULATORY_CARE_PROVIDER_SITE_OTHER): Payer: BC Managed Care – PPO | Admitting: Family Medicine

## 2023-02-28 ENCOUNTER — Telehealth: Payer: Self-pay

## 2023-02-28 ENCOUNTER — Encounter: Payer: Self-pay | Admitting: Family Medicine

## 2023-02-28 VITALS — BP 136/82 | HR 67 | Temp 97.6°F | Resp 18 | Ht 69.0 in | Wt 236.4 lb

## 2023-02-28 DIAGNOSIS — R972 Elevated prostate specific antigen [PSA]: Secondary | ICD-10-CM

## 2023-02-28 DIAGNOSIS — R590 Localized enlarged lymph nodes: Secondary | ICD-10-CM

## 2023-02-28 DIAGNOSIS — F325 Major depressive disorder, single episode, in full remission: Secondary | ICD-10-CM

## 2023-02-28 DIAGNOSIS — E669 Obesity, unspecified: Secondary | ICD-10-CM

## 2023-02-28 DIAGNOSIS — I10 Essential (primary) hypertension: Secondary | ICD-10-CM

## 2023-02-28 DIAGNOSIS — Z6834 Body mass index (BMI) 34.0-34.9, adult: Secondary | ICD-10-CM

## 2023-02-28 LAB — BASIC METABOLIC PANEL
BUN: 17 mg/dL (ref 6–23)
CO2: 33 mEq/L — ABNORMAL HIGH (ref 19–32)
Calcium: 9.9 mg/dL (ref 8.4–10.5)
Chloride: 99 mEq/L (ref 96–112)
Creatinine, Ser: 1.07 mg/dL (ref 0.40–1.50)
GFR: 73.79 mL/min (ref >60.00)
Glucose, Bld: 121 mg/dL — ABNORMAL HIGH (ref 70–99)
Potassium: 3.7 mEq/L (ref 3.5–5.1)
Sodium: 139 mEq/L (ref 135–145)

## 2023-02-28 LAB — PSA: PSA: 4.41 ng/mL — ABNORMAL HIGH (ref 0.10–4.00)

## 2023-02-28 MED ORDER — CITALOPRAM HYDROBROMIDE 40 MG PO TABS: 40.0000 mg | ORAL_TABLET | Freq: Every day | ORAL | 6 refills | Status: DC

## 2023-02-28 MED ORDER — ZEPBOUND 2.5 MG/0.5ML ~~LOC~~ SOAJ: 2.5000 mg | SUBCUTANEOUS | 1 refills | Status: DC

## 2023-02-28 NOTE — Telephone Encounter (Signed)
PA Initiated:   Christenbury Sanjeev (Key: [id number])

## 2023-03-02 ENCOUNTER — Telehealth (HOSPITAL_BASED_OUTPATIENT_CLINIC_OR_DEPARTMENT_OTHER): Payer: Self-pay

## 2023-03-05 NOTE — Telephone Encounter (Signed)
PA denied: The requested service is not a covered benefit per your benefit booklet or plan documents.

## 2023-03-06 ENCOUNTER — Encounter: Payer: Self-pay | Admitting: Family Medicine

## 2023-03-30 ENCOUNTER — Encounter: Payer: Self-pay | Admitting: Family Medicine

## 2023-04-03 ENCOUNTER — Ambulatory Visit (HOSPITAL_BASED_OUTPATIENT_CLINIC_OR_DEPARTMENT_OTHER)
Admission: RE | Admit: 2023-04-03 | Discharge: 2023-04-03 | Disposition: A | Discharge status: Home / Self Care | Payer: BC Managed Care – PPO | Source: Ambulatory Visit | Attending: Family Medicine | Admitting: Family Medicine

## 2023-04-03 DIAGNOSIS — I700 Atherosclerosis of aorta: Secondary | ICD-10-CM | POA: Diagnosis not present

## 2023-04-03 DIAGNOSIS — R591 Generalized enlarged lymph nodes: Secondary | ICD-10-CM | POA: Diagnosis not present

## 2023-04-03 DIAGNOSIS — R590 Localized enlarged lymph nodes: Secondary | ICD-10-CM | POA: Diagnosis not present

## 2023-04-03 DIAGNOSIS — D2372 Other benign neoplasm of skin of left lower limb, including hip: Secondary | ICD-10-CM | POA: Diagnosis not present

## 2023-04-03 MED ORDER — IOHEXOL 300 MG/ML  SOLN
100.0000 mL | Freq: Once | INTRAMUSCULAR | Status: AC | PRN
  Administered 2023-04-03: 100 mL via INTRAVENOUS

## 2023-04-05 ENCOUNTER — Encounter: Payer: Self-pay | Admitting: Family Medicine

## 2023-04-05 DIAGNOSIS — H8110 Benign paroxysmal vertigo, unspecified ear: Secondary | ICD-10-CM

## 2023-04-05 DIAGNOSIS — E669 Obesity, unspecified: Secondary | ICD-10-CM

## 2023-04-05 DIAGNOSIS — E785 Hyperlipidemia, unspecified: Secondary | ICD-10-CM

## 2023-04-06 MED ORDER — ZEPBOUND 5 MG/0.5ML ~~LOC~~ SOAJ: 5.0000 mg | SUBCUTANEOUS | 1 refills | Status: DC

## 2023-04-06 MED ORDER — TIRZEPATIDE-WEIGHT MANAGEMENT 7.5 MG/0.5ML ~~LOC~~ SOAJ
7.5000 mg | SUBCUTANEOUS | 0 refills | Status: DC
Start: 2023-04-06 — End: 2023-06-04

## 2023-04-09 MED ORDER — MECLIZINE HCL 25 MG PO TABS
25.0000 mg | ORAL_TABLET | Freq: Three times a day (TID) | ORAL | 0 refills | Status: DC | PRN
Start: 2023-04-09 — End: 2023-06-12

## 2023-04-09 NOTE — Telephone Encounter (Signed)
Called pt- it sounds like he has BPPV No HA, vision change or other neuro changes He has noted the sx for 3 -4 days now- first noted when he woke up and rolled over on Friday He tried home epley but felt very dizzy so he stopped!  Sent in meclizine for him to try as needed Can get him in for PT repositioning if needed He will keep me closely posted   Meds ordered this encounter  Medications   DISCONTD: tirzepatide (ZEPBOUND) 5 MG/0.5ML Pen    Sig: Inject 5 mg into the skin once a week.    Dispense:  2 mL    Refill:  1   tirzepatide (ZEPBOUND) 7.5 MG/0.5ML Pen    Sig: Inject 7.5 mg into the skin once a week.    Dispense:  2 mL    Refill:  0   meclizine (ANTIVERT) 25 MG tablet    Sig: Take 1 tablet (25 mg total) by mouth 3 (three) times daily as needed for dizziness.    Dispense:  30 tablet    Refill:  0

## 2023-04-09 NOTE — Addendum Note (Signed)
Addended by: Abbe Amsterdam C on: 04/09/2023 03:25 PM   Modules accepted: Orders

## 2023-04-10 ENCOUNTER — Encounter: Payer: Self-pay | Admitting: Family Medicine

## 2023-04-10 DIAGNOSIS — R590 Localized enlarged lymph nodes: Secondary | ICD-10-CM

## 2023-04-11 ENCOUNTER — Encounter: Payer: Self-pay | Admitting: Family Medicine

## 2023-04-16 NOTE — Telephone Encounter (Signed)
Called him and got this straightened out

## 2023-04-24 ENCOUNTER — Other Ambulatory Visit (HOSPITAL_COMMUNITY): Payer: Self-pay

## 2023-04-24 ENCOUNTER — Telehealth: Payer: Self-pay

## 2023-04-24 NOTE — Telephone Encounter (Addendum)
Per test claim, weight loss medications are not covered by pt's plan

## 2023-04-24 NOTE — Telephone Encounter (Signed)
Please see below.

## 2023-04-24 NOTE — Telephone Encounter (Signed)
Received fax stating that a PA was needed for Rx. Please start PA.

## 2023-05-30 NOTE — Progress Notes (Signed)
[facility] [address], [address] [place], [place] [address] [phone]  Date:  06/04/2023   Name:  Henry Wallace   DOB:  05-27-1961   MRN:  [id number]  PCP:  Pearline Cables, MD    Chief Complaint: Annual Exam (Concerns/ questions: Zepbound refill/dose/Flu shot today: yes)   History of Present Illness:  Henry Wallace is a 62 y.o. very pleasant male patient who presents with the following:  Patient seen today for physical exam-most recent visit with myself was in July History of hypertension, dyslipidemia, episode of ocular migraine in February of 2023   His cardiologist is Dr. Allyson Sabal who is following him for hypertension, hyperlipidemia, elevated coronary calcium. His coronary calcium scan showed possible mediastinal lymphadenopathy which has been evaluated by GI.  We did a follow-up chest CT in August-I spoke with radiology at that time who noted lymph nodes were stable, reasonable to repeat CT in 1 year to document 2 years of stability IMPRESSION: 1. Stable prominent nonspecific mediastinal, right hilar, and left axillary lymph nodes. 2. Multi-vessel coronary artery calcifications. 3. Aortic atherosclerosis. 4. Hepatic steatosis.  Flu vaccine- give today  COVID booster Colonoscopy is up-to-date  His PSA had trended up in July, I had asked if he was still a patient at [facility] urology-/??  Lab Results  Component Value Date   PSA 4.41 (H) 02/28/2023   PSA 3.28 10/16/2022   PSA 2.78 06/30/2022   He has been paying cash for Zepbound and is currently on 7.5 mg He notes this seem to be working  He is down to 212- he would like to   [employer] Readings from Last 3 Encounters:  06/04/23 215 lb (97.5 kg)  02/28/23 236 lb 6.4 oz (107.2 kg)  10/23/22 237 lb (107.5 kg)   He has had some bone spurs removed from his left ankle in the past- close to 30 years ago He notes his ankle has done really well overall  Patient  Active Problem List   Diagnosis Date Noted   Hyperlipidemia 07/05/2022   Elevated coronary artery calcium score 07/05/2022   Carotid stenosis, asymptomatic, bilateral 10/18/2021   MDD (major depressive disorder), recurrent, in full remission (HCC) 11/04/2015   OBESITY, UNSPECIFIED 07/06/2009   HYPERTENSION, BENIGN 07/06/2009   CHEST PAIN, PRECORDIAL 07/06/2009    Past Medical History:  Diagnosis Date   Adenomatous colon polyp    Coronary artery disease    Diverticulosis    Hepatic steatosis    Hypertension    Migraines     Past Surgical History:  Procedure Laterality Date   ADENOIDECTOMY     ANKLE SURGERY Left 1994   HERNIA REPAIR     PROSTATE SURGERY     VASECTOMY      Social History   Tobacco Use   Smoking status: Former    Types: Cigarettes   Smokeless tobacco: Never  Substance Use Topics   Alcohol use: Yes    Comment: liquor   Drug use: No    Family History  Problem Relation Age of Onset   Diverticulitis Mother    Diabetes Maternal Grandmother    Lung cancer Maternal Grandfather    Heart disease Paternal Grandfather    Colon cancer Neg Hx    Stomach cancer Neg Hx    Esophageal cancer Neg Hx     No Known Allergies  Medication list has been reviewed and updated.  Current Outpatient Medications on File  Prior to Visit  Medication Sig Dispense Refill   5-Hydroxytryptophan (5-HTP) 100 MG CAPS Take by mouth.     amLODipine (NORVASC) 10 MG tablet TAKE 1 TABLET(10 MG) BY MOUTH DAILY 90 tablet 3   aspirin 81 MG chewable tablet Chew by mouth daily.     citalopram (CELEXA) 40 MG tablet Take 1 tablet (40 mg total) by mouth daily. 30 tablet 6   hydrochlorothiazide (HYDRODIURIL) 25 MG tablet Take 1 tablet (25 mg total) by mouth daily. 90 tablet 3   multivitamin-iron-minerals-folic acid (CENTRUM) chewable tablet Chew 1 tablet by mouth daily.     OVER THE COUNTER MEDICATION Vitamin D 3 5000 mg taking daily     rosuvastatin (CRESTOR) 10 MG tablet TAKE 1 TABLET(10  MG) BY MOUTH DAILY 90 tablet 3   meclizine (ANTIVERT) 25 MG tablet Take 1 tablet (25 mg total) by mouth 3 (three) times daily as needed for dizziness. (Patient not taking: Reported on 06/04/2023) 30 tablet 0   No current facility-administered medications on file prior to visit.    Review of Systems:  As per HPI- otherwise negative.   Physical Examination: Vitals:   06/04/23 1325  BP: 124/64  Pulse: 72  Resp: 18  Temp: 98 F (36.7 C)  SpO2: 98%   Vitals:   06/04/23 1325  Weight: 215 lb (97.5 kg)  Height: 5\' 9"  (1.753 m)   Body mass index is 31.75 kg/m. Ideal Body Weight: Weight in (lb) to have BMI = 25: 168.9  GEN: no acute distress. Mildly obese, has lost weight, looks well  HEENT: Atraumatic, Normocephalic.  Bilateral TM wnl, oropharynx normal.  PEERL,EOMI.  ' Ears and Nose: No external deformity. CV: RRR, No M/G/R. No JVD. No thrill. No extra heart sounds. PULM: CTA B, no wheezes, crackles, rhonchi. No retractions. No resp. distress. No accessory muscle use. ABD: S, NT, ND, +BS. No rebound. No HSM. EXTR: No c/c/e PSYCH: Normally interactive. Conversant.  Left foot: There is minimal thickening of the left ankle compared with the right.  He does have a bunion with some callus formation on the left first MCP joint  Assessment and Plan: Physical exam - Plan: Flu vaccine trivalent PF, 6mos and older(Flulaval,Afluria,Fluarix,Fluzone)  Mediastinal lymphadenopathy  Dyslipidemia  Essential hypertension - Plan: CBC, Comprehensive metabolic panel, TSH, amLODipine (NORVASC) 5 MG tablet, hydrochlorothiazide (HYDRODIURIL) 25 MG tablet  Increased prostate specific antigen (PSA) velocity - Plan: PSA  Class 1 obesity with serious comorbidity and body mass index (BMI) of 34.0 to 34.9 in adult, unspecified obesity type - Plan: tirzepatide (ZEPBOUND) 10 MG/0.5ML Pen  Left foot pain - Plan: Ambulatory referral to Orthopedic Surgery  Major depressive disorder with single episode, in  remission (HCC) - Plan: citalopram (CELEXA) 40 MG tablet  Immunization due - Plan: Flu vaccine trivalent PF, 6mos and older(Flulaval,Afluria,Fluarix,Fluzone)  Physical exam today.  Encouraged healthy diet and exercise routine Will plan further follow- up pending labs.  BP has dropped with weight loss- decrease amlodipine to 5 mg  Referral to see Dr. Victorino Dike regarding his left foot He is making progress with Zepbound 7.5, his goal weight is approximately 200 pounds.  Will increase to 10 mg, he will keep me posted  They continue to notice some issues with fatigue, would be interested in a testosterone level.  However, I explained it is too late in the day to order testosterone now.  We will get the rest of his lab work, I ordered testosterone panel which can be done as a lab  visit only in the early morning at his convenience   Signed Abbe Amsterdam, MD

## 2023-05-30 NOTE — Patient Instructions (Addendum)
It was good to see you again today!   I will get you set up with Dr Victorino Dike to look at your ankle  Increase Zepbound to 10 mg- let me know when you reach your goal weight ?about 200 lbs- and we can start to taper down  I will be in touch with your labs I can also add a testosterone level order for you to have done at your convenience as a lab visit only- early am please!

## 2023-06-04 ENCOUNTER — Other Ambulatory Visit: Payer: Self-pay | Admitting: Family Medicine

## 2023-06-04 ENCOUNTER — Ambulatory Visit (INDEPENDENT_AMBULATORY_CARE_PROVIDER_SITE_OTHER): Payer: BC Managed Care – PPO | Admitting: Family Medicine

## 2023-06-04 VITALS — BP 124/64 | HR 72 | Temp 98.0°F | Resp 18 | Ht 69.0 in | Wt 215.0 lb

## 2023-06-04 DIAGNOSIS — E785 Hyperlipidemia, unspecified: Secondary | ICD-10-CM

## 2023-06-04 DIAGNOSIS — Z0000 Encounter for general adult medical examination without abnormal findings: Secondary | ICD-10-CM

## 2023-06-04 DIAGNOSIS — R590 Localized enlarged lymph nodes: Secondary | ICD-10-CM

## 2023-06-04 DIAGNOSIS — Z23 Encounter for immunization: Secondary | ICD-10-CM

## 2023-06-04 DIAGNOSIS — R972 Elevated prostate specific antigen [PSA]: Secondary | ICD-10-CM

## 2023-06-04 DIAGNOSIS — E66811 Obesity, class 1: Secondary | ICD-10-CM

## 2023-06-04 DIAGNOSIS — R5383 Other fatigue: Secondary | ICD-10-CM

## 2023-06-04 DIAGNOSIS — Z6834 Body mass index (BMI) 34.0-34.9, adult: Secondary | ICD-10-CM

## 2023-06-04 DIAGNOSIS — M79672 Pain in left foot: Secondary | ICD-10-CM

## 2023-06-04 DIAGNOSIS — I10 Essential (primary) hypertension: Secondary | ICD-10-CM | POA: Diagnosis not present

## 2023-06-04 DIAGNOSIS — F325 Major depressive disorder, single episode, in full remission: Secondary | ICD-10-CM

## 2023-06-04 MED ORDER — CITALOPRAM HYDROBROMIDE 40 MG PO TABS
40.0000 mg | ORAL_TABLET | Freq: Every day | ORAL | 3 refills | Status: DC
Start: 2023-06-04 — End: 2023-07-21

## 2023-06-04 MED ORDER — ZEPBOUND 10 MG/0.5ML ~~LOC~~ SOAJ
10.0000 mg | SUBCUTANEOUS | 1 refills | Status: DC
Start: 2023-06-04 — End: 2023-07-05

## 2023-06-04 MED ORDER — HYDROCHLOROTHIAZIDE 25 MG PO TABS
25.0000 mg | ORAL_TABLET | Freq: Every day | ORAL | 3 refills | Status: DC
Start: 2023-06-04 — End: 2024-03-31

## 2023-06-04 MED ORDER — AMLODIPINE BESYLATE 5 MG PO TABS: 5.0000 mg | ORAL_TABLET | Freq: Every day | ORAL | 3 refills | Status: DC

## 2023-06-05 ENCOUNTER — Encounter: Payer: Self-pay | Admitting: Family Medicine

## 2023-06-05 LAB — COMPREHENSIVE METABOLIC PANEL
ALT: 33 U/L (ref 0–53)
AST: 25 U/L (ref 0–37)
Albumin: 4.7 g/dL (ref 3.5–5.2)
Alkaline Phosphatase: 65 U/L (ref 39–117)
BUN: 12 mg/dL (ref 6–23)
CO2: 26 meq/L (ref 19–32)
Calcium: 9.7 mg/dL (ref 8.4–10.5)
Chloride: 103 meq/L (ref 96–112)
Creatinine, Ser: 1.1 mg/dL (ref 0.40–1.50)
GFR: 71.24 mL/min (ref >60.00)
Glucose, Bld: 90 mg/dL (ref 70–99)
Potassium: 4 meq/L (ref 3.5–5.1)
Sodium: 139 meq/L (ref 135–145)
Total Bilirubin: 0.8 mg/dL (ref 0.2–1.2)
Total Protein: 6.7 g/dL (ref 6.0–8.3)

## 2023-06-05 LAB — TSH: TSH: 1.86 u[IU]/mL (ref 0.35–5.50)

## 2023-06-05 LAB — CBC
HCT: 41.3 % (ref 39.0–52.0)
Hemoglobin: 14 g/dL (ref 13.0–17.0)
MCHC: 33.8 g/dL (ref 30.0–36.0)
MCV: 92.4 fL (ref 78.0–100.0)
Platelets: 283 10*3/uL (ref 150.0–400.0)
RBC: 4.47 Mil/uL (ref 4.22–5.81)
RDW: 13.9 % (ref 11.5–15.5)
WBC: 6.3 10*3/uL (ref 4.0–10.5)

## 2023-06-05 LAB — LIPID PANEL
Cholesterol: 99 mg/dL (ref 0–200)
HDL: 38.6 mg/dL — ABNORMAL LOW (ref >39.00)
LDL Cholesterol: 44 mg/dL (ref 0–99)
NonHDL: 60.17
Total CHOL/HDL Ratio: 3
Triglycerides: 79 mg/dL (ref 0.0–149.0)
VLDL: 15.8 mg/dL (ref 0.0–40.0)

## 2023-06-05 LAB — PSA: PSA: 5.2 ng/mL — ABNORMAL HIGH (ref 0.10–4.00)

## 2023-06-05 NOTE — Addendum Note (Signed)
Addended by: Abbe Amsterdam C on: 06/05/2023 02:34 PM   Modules accepted: Orders

## 2023-06-11 DIAGNOSIS — M2021 Hallux rigidus, right foot: Secondary | ICD-10-CM | POA: Diagnosis not present

## 2023-06-11 DIAGNOSIS — M2022 Hallux rigidus, left foot: Secondary | ICD-10-CM | POA: Diagnosis not present

## 2023-06-11 DIAGNOSIS — M25572 Pain in left ankle and joints of left foot: Secondary | ICD-10-CM | POA: Diagnosis not present

## 2023-06-11 DIAGNOSIS — M2012 Hallux valgus (acquired), left foot: Secondary | ICD-10-CM | POA: Diagnosis not present

## 2023-06-12 ENCOUNTER — Encounter (HOSPITAL_COMMUNITY): Payer: Self-pay

## 2023-06-12 ENCOUNTER — Emergency Department (HOSPITAL_COMMUNITY)
Admission: EM | Admit: 2023-06-12 | Discharge: 2023-06-12 | Disposition: A | Discharge status: Home / Self Care | Payer: BC Managed Care – PPO | Attending: Emergency Medicine | Admitting: Emergency Medicine

## 2023-06-12 ENCOUNTER — Other Ambulatory Visit: Payer: Self-pay

## 2023-06-12 DIAGNOSIS — R1111 Vomiting without nausea: Secondary | ICD-10-CM | POA: Diagnosis not present

## 2023-06-12 DIAGNOSIS — R231 Pallor: Secondary | ICD-10-CM | POA: Diagnosis not present

## 2023-06-12 DIAGNOSIS — Z7982 Long term (current) use of aspirin: Secondary | ICD-10-CM | POA: Diagnosis not present

## 2023-06-12 DIAGNOSIS — R197 Diarrhea, unspecified: Secondary | ICD-10-CM | POA: Insufficient documentation

## 2023-06-12 DIAGNOSIS — R112 Nausea with vomiting, unspecified: Secondary | ICD-10-CM | POA: Insufficient documentation

## 2023-06-12 DIAGNOSIS — R531 Weakness: Secondary | ICD-10-CM | POA: Insufficient documentation

## 2023-06-12 DIAGNOSIS — R110 Nausea: Secondary | ICD-10-CM | POA: Diagnosis not present

## 2023-06-12 LAB — URINALYSIS, ROUTINE W REFLEX MICROSCOPIC
Bilirubin Urine: NEGATIVE
Glucose, UA: NEGATIVE mg/dL
Hgb urine dipstick: NEGATIVE
Ketones, ur: 5 mg/dL — AB
Leukocytes,Ua: NEGATIVE
Nitrite: NEGATIVE
Protein, ur: 100 mg/dL — AB
Specific Gravity, Urine: 1.019 (ref 1.005–1.030)
pH: 5 (ref 5.0–8.0)

## 2023-06-12 LAB — LIPASE, BLOOD: Lipase: 47 U/L (ref 11–51)

## 2023-06-12 LAB — COMPREHENSIVE METABOLIC PANEL
ALT: 35 U/L (ref 0–44)
AST: 32 U/L (ref 15–41)
Albumin: 5.2 g/dL — ABNORMAL HIGH (ref 3.5–5.0)
Alkaline Phosphatase: 71 U/L (ref 38–126)
Anion gap: 18 — ABNORMAL HIGH (ref 5–15)
BUN: 21 mg/dL (ref 8–23)
CO2: 17 mmol/L — ABNORMAL LOW (ref 22–32)
Calcium: 10.3 mg/dL (ref 8.9–10.3)
Chloride: 103 mmol/L (ref 98–111)
Creatinine, Ser: 1.75 mg/dL — ABNORMAL HIGH (ref 0.61–1.24)
GFR, Estimated: 43 mL/min — ABNORMAL LOW (ref >60)
Glucose, Bld: 126 mg/dL — ABNORMAL HIGH (ref 70–99)
Potassium: 3.3 mmol/L — ABNORMAL LOW (ref 3.5–5.1)
Sodium: 138 mmol/L (ref 135–145)
Total Bilirubin: 1.1 mg/dL (ref 0.3–1.2)
Total Protein: 8 g/dL (ref 6.5–8.1)

## 2023-06-12 LAB — CBC
HCT: 47.9 % (ref 39.0–52.0)
Hemoglobin: 16.6 g/dL (ref 13.0–17.0)
MCH: 31.6 pg (ref 26.0–34.0)
MCHC: 34.7 g/dL (ref 30.0–36.0)
MCV: 91.1 fL (ref 80.0–100.0)
Platelets: 332 10*3/uL (ref 150–400)
RBC: 5.26 MIL/uL (ref 4.22–5.81)
RDW: 13.3 % (ref 11.5–15.5)
WBC: 11.2 10*3/uL — ABNORMAL HIGH (ref 4.0–10.5)
nRBC: 0 % (ref 0.0–0.2)

## 2023-06-12 MED ORDER — ONDANSETRON HCL 4 MG/2ML IJ SOLN
4.0000 mg | Freq: Once | INTRAMUSCULAR | Status: AC
  Administered 2023-06-12: 4 mg via INTRAVENOUS
  Filled 2023-06-12: qty 2

## 2023-06-12 MED ORDER — LACTATED RINGERS IV BOLUS
1000.0000 mL | Freq: Once | INTRAVENOUS | Status: AC
  Administered 2023-06-12: 1000 mL via INTRAVENOUS

## 2023-06-12 MED ORDER — ONDANSETRON 4 MG PO TBDP: ORAL_TABLET | ORAL | 0 refills | Status: DC

## 2023-06-12 NOTE — ED Provider Triage Note (Signed)
Emergency Medicine Provider Triage Evaluation Note  Henry Wallace , a 63 y.o. male  was evaluated in triage.  Pt complains of nausea, intermittent constipation and diarrhea x 2 days, abdominal bloating.  Review of Systems  Positive: Constipation, diarrhea, nausea and vomiting Negative: Cough, congestion  Physical Exam  BP 97/80 (BP Location: Right Arm)   Pulse 96   Temp 97.7 F (36.5 C)   Resp 16   Ht 5\' 9"  (1.753 m)   Wt 97.5 kg   SpO2 99%   BMI 31.74 kg/m  Gen:   Awake, sitting upright, appears nauseated Resp:  Normal effort  MSK:   Moves extremities without difficulty  Other:  Abdominal exam with some mild RUQ ttp, no rigidity or guarding  Medical Decision Making  Medically screening exam initiated at 4:57 PM.  Appropriate orders placed.  SUMEDH SHINSATO was informed that the remainder of the evaluation will be completed by another provider, this initial triage assessment does not replace that evaluation, and the importance of remaining in the ED until their evaluation is complete.  Abdominal labs, CT imaging, zofran ordered   Terald Sleeper, MD 06/12/23 [phone]

## 2023-06-12 NOTE — ED Provider Notes (Signed)
[place] [facility] AT [facility] Provider Note   CSN: [id number] Arrival date & time: 06/12/23  1609     History  Chief Complaint  Patient presents with   Emesis   Diarrhea   Weakness    Henry Wallace is a 62 y.o. male.  62 yo M with a chief complaint of nausea vomiting and diarrhea.  Patient said that earlier this week he felt he was a bit constipated and so took a Dulcolax.  Had a significant bowel movement that next morning and then the next day developed diarrhea.  He started having nausea and vomiting as well.  He notes having an episode where he felt very weak after vomiting once and 911 was called.  He feels quite a bit better after that.  Was given some Zofran and IV fluids with EMS.  Denies any abdominal pain currently.   Emesis Associated symptoms: diarrhea   Diarrhea Associated symptoms: vomiting   Weakness Associated symptoms: diarrhea and vomiting        Home Medications Prior to Admission medications   Medication Sig Start Date End Date Taking? Authorizing Provider  5-Hydroxytryptophan (5-HTP) 100 MG CAPS Take 200 mg by mouth every evening.   Yes [provider]  acetaminophen (TYLENOL) 500 MG tablet Take 500 mg by mouth every 6 (six) hours as needed for mild pain (pain score 1-3) or moderate pain (pain score 4-6).   Yes [provider]  amLODipine (NORVASC) 5 MG tablet Take 1 tablet (5 mg total) by mouth daily. Patient taking differently: Take 5 mg by mouth at bedtime. 06/04/23  Yes Copland, Gwenlyn Found, MD  aspirin 81 MG chewable tablet Chew by mouth daily.   Yes [provider]  hydrochlorothiazide (HYDRODIURIL) 25 MG tablet Take 1 tablet (25 mg total) by mouth daily. 06/04/23  Yes Copland, Gwenlyn Found, MD  IBUPROFEN PO Take 2-4 tablets by mouth daily as needed (pain).   Yes [provider]  loperamide (IMODIUM A-D) 2 MG tablet Take 2-4 mg by mouth 4 (four) times daily as needed for diarrhea or loose  stools.   Yes [provider]  multivitamin-iron-minerals-folic acid (CENTRUM) chewable tablet Chew 1 tablet by mouth daily.   Yes [provider]  ondansetron (ZOFRAN-ODT) 4 MG disintegrating tablet 4mg  ODT q4 hours prn nausea/vomit 06/12/23  Yes Melene Plan, DO  OVER THE COUNTER MEDICATION Take 5,000 Units by mouth in the morning. Vitamin D 3 5000 mg taking daily   Yes [provider]  rosuvastatin (CRESTOR) 10 MG tablet TAKE 1 TABLET(10 MG) BY MOUTH DAILY Patient taking differently: Take 5 mg by mouth in the morning. TAKE 1 TABLET(10 MG) BY MOUTH DAILY 06/12/22  Yes Copland, Gwenlyn Found, MD  tirzepatide (ZEPBOUND) 10 MG/0.5ML Pen Inject 10 mg into the skin once a week. Patient taking differently: Inject 7.5 mg into the skin once a week. 06/04/23  Yes Copland, Gwenlyn Found, MD  citalopram (CELEXA) 40 MG tablet Take 1 tablet (40 mg total) by mouth daily. Patient taking differently: Take 20 mg by mouth in the morning. 06/04/23   Copland, Gwenlyn Found, MD      Allergies    Patient has no known allergies.    Review of Systems   Review of Systems  Gastrointestinal:  Positive for diarrhea and vomiting.  Neurological:  Positive for weakness.    Physical Exam Updated Vital Signs BP 105/81   Pulse 93   Temp 98.7 F (37.1 C) (Oral)  Resp 17   Ht 5\' 9"  (1.753 m)   Wt 97.5 kg   SpO2 93%   BMI 31.74 kg/m  Physical Exam Vitals and nursing note reviewed.  Constitutional:      Appearance: He is well-developed.  HENT:     Head: Normocephalic and atraumatic.  Eyes:     Pupils: Pupils are equal, round, and reactive to light.  Neck:     Vascular: No JVD.  Cardiovascular:     Rate and Rhythm: Normal rate and regular rhythm.     Heart sounds: No murmur heard.    No friction rub. No gallop.  Pulmonary:     Effort: No respiratory distress.     Breath sounds: No wheezing.  Abdominal:     General: There is no distension.     Tenderness: There is no abdominal tenderness.  There is no guarding or rebound.     Comments: Benign abdominal exam  Musculoskeletal:        General: Normal range of motion.     Cervical back: Normal range of motion and neck supple.  Skin:    Coloration: Skin is not pale.     Findings: No rash.  Neurological:     Mental Status: He is alert and oriented to person, place, and time.  Psychiatric:        Behavior: Behavior normal.     ED Results / Procedures / Treatments   Labs (all labs ordered are listed, but only abnormal results are displayed) Labs Reviewed  COMPREHENSIVE METABOLIC PANEL - Abnormal; Notable for the following components:      Result Value   Potassium 3.3 (*)    CO2 17 (*)    Glucose, Bld 126 (*)    Creatinine, Ser 1.75 (*)    Albumin 5.2 (*)    GFR, Estimated 43 (*)    Anion gap 18 (*)    All other components within normal limits  CBC - Abnormal; Notable for the following components:   WBC 11.2 (*)    All other components within normal limits  URINALYSIS, ROUTINE W REFLEX MICROSCOPIC - Abnormal; Notable for the following components:   Color, Urine AMBER (*)    APPearance CLOUDY (*)    Ketones, ur 5 (*)    Protein, ur 100 (*)    Bacteria, UA RARE (*)    All other components within normal limits  LIPASE, BLOOD    EKG None  Radiology No results found.  Procedures Procedures    Medications Ordered in ED Medications  ondansetron (ZOFRAN) injection 4 mg (4 mg Intravenous Given 06/12/23 1715)  lactated ringers bolus 1,000 mL (0 mLs Intravenous Stopped 06/12/23 2044)    ED Course/ Medical Decision Making/ A&P                                 Medical Decision Making Amount and/or Complexity of Data Reviewed Labs: ordered.  Risk Prescription drug management.   62 yo M with a chief complaint of nausea vomiting and diarrhea.  Patient had episode at home that sounds like he had a vasovagal event.  Did not completely lose consciousness.  Lab work here consistent with dehydration.  AKI.  Will  give a fluid bolus here.  Antiemetics.  Oral trial.  Patient feeling much better on repeat assessment.  Able to tolerate by mouth without issue.  Requesting discharge home.  PCP follow-up.  10:08 PM:  I  have discussed the diagnosis/risks/treatment options with the patient.  Evaluation and diagnostic testing in the emergency department does not suggest an emergent condition requiring admission or immediate intervention beyond what has been performed at this time.  They will follow up with PCP. We also discussed returning to the ED immediately if new or worsening sx occur. We discussed the sx which are most concerning (e.g., sudden worsening pain, fever, inability to tolerate by mouth) that necessitate immediate return. Medications administered to the patient during their visit and any new prescriptions provided to the patient are listed below.  Medications given during this visit Medications  ondansetron (ZOFRAN) injection 4 mg (4 mg Intravenous Given 06/12/23 1715)  lactated ringers bolus 1,000 mL (0 mLs Intravenous Stopped 06/12/23 2044)     The patient appears reasonably screen and/or stabilized for discharge and I doubt any other medical condition or other [facility] requiring further screening, evaluation, or treatment in the ED at this time prior to discharge.          Final Clinical Impression(s) / ED Diagnoses Final diagnoses:  Nausea vomiting and diarrhea    Rx / DC Orders ED Discharge Orders          Ordered    ondansetron (ZOFRAN-ODT) 4 MG disintegrating tablet        06/12/23 2135              Melene Plan, DO 06/12/23 2208

## 2023-06-12 NOTE — ED Triage Notes (Signed)
Pt arrived via [facility] from home for c/o N/V/D, belching and generalized weakness started yesterday

## 2023-06-12 NOTE — Discharge Instructions (Signed)
Please follow up with your family doc in the office.  I prescribed you some nausea medicine for nausea.  You can take Imodium for diarrhea.  Please return for inability eat or drink or if you develop worsening abdominal discomfort.

## 2023-06-14 ENCOUNTER — Encounter: Payer: Self-pay | Admitting: Family Medicine

## 2023-06-14 ENCOUNTER — Telehealth: Payer: Self-pay

## 2023-06-14 NOTE — Telephone Encounter (Signed)
Dr Loistine Chance sent this message via her own mychart. So I copied and pasted into Theodus's chart:    "Dr. Patsy Lager,  Henry Wallace started getting sick with diarrhea and belching Monday afternoon. He had serious diarrhea all night and through the first half of Tuesday. I finally got him to eat some cheese toast around 1pm. Within an hour or so he began vomiting in addition to the diarrhea and collapsed in the hallway floor face down and continuing to vomit. I called 911 and he was transported to [facility] by EMS. BP and EKG looked fine. Doctor 1 said he needed CT of abdomen as problem could be gallbladder, pancreas, appendix or other. Many hours went by. Doctor 2 came in said it was probably a virus, had no clue about Doctor 1, they would give him fluids and he should go home. Also important, when Henry Wallace laid back he was much better than in upright position. He didn't vomit or have diarrhea while at the hospital. The diarrhea began again as soon as we left [facility]. It went on till midnight. He slept all night and most of Wednesday. No diarrhea or vomiting. Got him to eat something at 8pm and the diarrhea was back  immediately along with gas and bloating that was very painful. Diarrhea again all last night. We are concerned that this is more serious than a virus. [place] was a kind but confused place. Don't have confidence in returning there. What do you think?  Thanks for your guidance.  Henry Wallace."

## 2023-06-16 ENCOUNTER — Emergency Department (HOSPITAL_BASED_OUTPATIENT_CLINIC_OR_DEPARTMENT_OTHER)
Admission: EM | Admit: 2023-06-16 | Discharge: 2023-06-16 | Disposition: A | Discharge status: Home / Self Care | Payer: BC Managed Care – PPO | Attending: Emergency Medicine | Admitting: Emergency Medicine

## 2023-06-16 ENCOUNTER — Other Ambulatory Visit (HOSPITAL_BASED_OUTPATIENT_CLINIC_OR_DEPARTMENT_OTHER): Payer: Self-pay

## 2023-06-16 ENCOUNTER — Encounter (HOSPITAL_BASED_OUTPATIENT_CLINIC_OR_DEPARTMENT_OTHER): Payer: Self-pay

## 2023-06-16 ENCOUNTER — Telehealth (HOSPITAL_BASED_OUTPATIENT_CLINIC_OR_DEPARTMENT_OTHER): Payer: Self-pay | Admitting: Emergency Medicine

## 2023-06-16 ENCOUNTER — Emergency Department (HOSPITAL_BASED_OUTPATIENT_CLINIC_OR_DEPARTMENT_OTHER): Payer: BC Managed Care – PPO

## 2023-06-16 DIAGNOSIS — Z79899 Other long term (current) drug therapy: Secondary | ICD-10-CM | POA: Insufficient documentation

## 2023-06-16 DIAGNOSIS — Z7982 Long term (current) use of aspirin: Secondary | ICD-10-CM | POA: Insufficient documentation

## 2023-06-16 DIAGNOSIS — R112 Nausea with vomiting, unspecified: Secondary | ICD-10-CM | POA: Diagnosis not present

## 2023-06-16 DIAGNOSIS — N200 Calculus of kidney: Secondary | ICD-10-CM | POA: Diagnosis not present

## 2023-06-16 DIAGNOSIS — R1032 Left lower quadrant pain: Secondary | ICD-10-CM | POA: Diagnosis not present

## 2023-06-16 DIAGNOSIS — R55 Syncope and collapse: Secondary | ICD-10-CM | POA: Diagnosis not present

## 2023-06-16 DIAGNOSIS — K5730 Diverticulosis of large intestine without perforation or abscess without bleeding: Secondary | ICD-10-CM | POA: Diagnosis not present

## 2023-06-16 DIAGNOSIS — I10 Essential (primary) hypertension: Secondary | ICD-10-CM | POA: Insufficient documentation

## 2023-06-16 DIAGNOSIS — E860 Dehydration: Secondary | ICD-10-CM | POA: Diagnosis not present

## 2023-06-16 DIAGNOSIS — I2510 Atherosclerotic heart disease of native coronary artery without angina pectoris: Secondary | ICD-10-CM | POA: Insufficient documentation

## 2023-06-16 DIAGNOSIS — R197 Diarrhea, unspecified: Secondary | ICD-10-CM | POA: Diagnosis not present

## 2023-06-16 DIAGNOSIS — N179 Acute kidney failure, unspecified: Secondary | ICD-10-CM | POA: Diagnosis not present

## 2023-06-16 LAB — CBC
HCT: 46.8 % (ref 39.0–52.0)
Hemoglobin: 17 g/dL (ref 13.0–17.0)
MCH: 32 pg (ref 26.0–34.0)
MCHC: 36.3 g/dL — ABNORMAL HIGH (ref 30.0–36.0)
MCV: 88 fL (ref 80.0–100.0)
Platelets: 345 10*3/uL (ref 150–400)
RBC: 5.32 MIL/uL (ref 4.22–5.81)
RDW: 13.4 % (ref 11.5–15.5)
WBC: 8.4 10*3/uL (ref 4.0–10.5)
nRBC: 0 % (ref 0.0–0.2)

## 2023-06-16 LAB — COMPREHENSIVE METABOLIC PANEL
ALT: 17 U/L (ref 0–44)
AST: 17 U/L (ref 15–41)
Albumin: 5 g/dL (ref 3.5–5.0)
Alkaline Phosphatase: 73 U/L (ref 38–126)
Anion gap: 14 (ref 5–15)
BUN: 25 mg/dL — ABNORMAL HIGH (ref 8–23)
CO2: 25 mmol/L (ref 22–32)
Calcium: 10 mg/dL (ref 8.9–10.3)
Chloride: 97 mmol/L — ABNORMAL LOW (ref 98–111)
Creatinine, Ser: 1.99 mg/dL — ABNORMAL HIGH (ref 0.61–1.24)
GFR, Estimated: 37 mL/min — ABNORMAL LOW (ref >60)
Glucose, Bld: 147 mg/dL — ABNORMAL HIGH (ref 70–99)
Potassium: 3.5 mmol/L (ref 3.5–5.1)
Sodium: 136 mmol/L (ref 135–145)
Total Bilirubin: 1.3 mg/dL — ABNORMAL HIGH (ref 0.3–1.2)
Total Protein: 7.2 g/dL (ref 6.5–8.1)

## 2023-06-16 LAB — URINALYSIS, ROUTINE W REFLEX MICROSCOPIC
Bilirubin Urine: NEGATIVE
Glucose, UA: NEGATIVE mg/dL
Hgb urine dipstick: NEGATIVE
Ketones, ur: 15 mg/dL — AB
Leukocytes,Ua: NEGATIVE
Nitrite: NEGATIVE
Protein, ur: NEGATIVE mg/dL
Specific Gravity, Urine: 1.039 — ABNORMAL HIGH (ref 1.005–1.030)
pH: 6 (ref 5.0–8.0)

## 2023-06-16 LAB — TROPONIN I (HIGH SENSITIVITY)
Troponin I (High Sensitivity): 6 ng/L (ref <18)
Troponin I (High Sensitivity): 6 ng/L (ref <18)

## 2023-06-16 LAB — LIPASE, BLOOD: Lipase: 28 U/L (ref 11–51)

## 2023-06-16 MED ORDER — LACTATED RINGERS IV BOLUS
1000.0000 mL | Freq: Once | INTRAVENOUS | Status: AC
  Administered 2023-06-16: 1000 mL via INTRAVENOUS

## 2023-06-16 MED ORDER — PANTOPRAZOLE SODIUM 40 MG IV SOLR
40.0000 mg | Freq: Once | INTRAVENOUS | Status: AC
  Administered 2023-06-16: 40 mg via INTRAVENOUS
  Filled 2023-06-16: qty 10

## 2023-06-16 MED ORDER — IOHEXOL 300 MG/ML  SOLN
100.0000 mL | Freq: Once | INTRAMUSCULAR | Status: AC | PRN
  Administered 2023-06-16: 100 mL via INTRAVENOUS

## 2023-06-16 MED ORDER — ONDANSETRON HCL 4 MG/2ML IJ SOLN
4.0000 mg | Freq: Once | INTRAMUSCULAR | Status: AC | PRN
  Administered 2023-06-16: 4 mg via INTRAVENOUS
  Filled 2023-06-16: qty 2

## 2023-06-16 MED ORDER — PANTOPRAZOLE SODIUM 40 MG PO TBEC: 40.0000 mg | DELAYED_RELEASE_TABLET | Freq: Every day | ORAL | 0 refills | Status: DC

## 2023-06-16 MED ORDER — ONDANSETRON 4 MG PO TBDP: ORAL_TABLET | ORAL | 0 refills | Status: DC

## 2023-06-16 NOTE — ED Notes (Signed)
This RN called lab and asked if Trop can be added to initial blood samples collected.

## 2023-06-16 NOTE — ED Notes (Signed)
Stool collected and sent to lab. Appearance brown and runny. Henry Wallace

## 2023-06-16 NOTE — ED Notes (Signed)
Pt to CT via w/c with tech.

## 2023-06-16 NOTE — ED Notes (Signed)
Pt CA&Ox4, ambulatory to triage but generalized weakness.

## 2023-06-16 NOTE — ED Notes (Signed)
Pt discharged in stable condition. Pt and spouse expressed understanding about discharge instructions and to follow up with pcp and to return to ER for any further concerns or complications. Pt ambulated out with even steady gait, no apparent distress.

## 2023-06-16 NOTE — ED Provider Notes (Signed)
[place] EMERGENCY DEPARTMENT AT [facility] Provider Note   CSN: [id number] Arrival date & time: 06/16/23  1000     History  Chief Complaint  Patient presents with   Nausea   Emesis    Henry Wallace is a 62 y.o. male.  HPI      62 year old male with a history of hypertension, dyslipidemia, mediastinal lymphadenopathy, depression, elevated coronary calcium score, who was seen in the emergency department 4 days ago with concern for nausea, vomiting and diarrhea and returns today with continued nausea, vomiting and diarrhea.    Monday began to have belching, Monday evening had diarrhea, had it all night, Tuesday tried imodium, then vomiting and diarrhea, had syncope, went to [facility], had labs, fluids, as soon got in the car diarrhea started again. Called family dr, if it continues be seen.  Diarrhea slowed, able to hold down water and pedialyte, Thursday started ok then yesterday was feeling hungry, given 2 eggs, still felt weak, ate a little bit, fries, chips, crackers cream cheese, then diarrhea back again and vomiting again.  Has not had hx of gallbladder issues himself.  No other sick contacts.  Sunday before it started was constipated and took one dulcolax and initially was dark with stoola fter that. No fever No significant abdominal pain No urinary symptoms This morning vomited twice  Past Medical History:  Diagnosis Date   Adenomatous colon polyp    Coronary artery disease    Diverticulosis    Hepatic steatosis    Hypertension    Migraines     Home Medications Prior to Admission medications   Medication Sig Start Date End Date Taking? Authorizing Provider  pantoprazole (PROTONIX) 40 MG tablet Take 1 tablet (40 mg total) by mouth daily. 06/16/23  Yes Benjiman Core, MD  5-Hydroxytryptophan (5-HTP) 100 MG CAPS Take 200 mg by mouth every evening.    [provider]  acetaminophen (TYLENOL) 500 MG tablet Take 500 mg by mouth every 6 (six)  hours as needed for mild pain (pain score 1-3) or moderate pain (pain score 4-6).    [provider]  amLODipine (NORVASC) 5 MG tablet Take 1 tablet (5 mg total) by mouth daily. Patient taking differently: Take 5 mg by mouth at bedtime. 06/04/23   Copland, Gwenlyn Found, MD  aspirin 81 MG chewable tablet Chew by mouth daily.    [provider]  citalopram (CELEXA) 40 MG tablet Take 1 tablet (40 mg total) by mouth daily. Patient taking differently: Take 20 mg by mouth in the morning. 06/04/23   Copland, Gwenlyn Found, MD  hydrochlorothiazide (HYDRODIURIL) 25 MG tablet Take 1 tablet (25 mg total) by mouth daily. 06/04/23   Copland, Gwenlyn Found, MD  IBUPROFEN PO Take 2-4 tablets by mouth daily as needed (pain).    [provider]  loperamide (IMODIUM A-D) 2 MG tablet Take 2-4 mg by mouth 4 (four) times daily as needed for diarrhea or loose stools.    [provider]  multivitamin-iron-minerals-folic acid (CENTRUM) chewable tablet Chew 1 tablet by mouth daily.    [provider]  ondansetron (ZOFRAN-ODT) 4 MG disintegrating tablet 4mg  ODT q4 hours prn nausea/vomit 06/16/23   Benjiman Core, MD  OVER THE COUNTER MEDICATION Take 5,000 Units by mouth in the morning. Vitamin D 3 5000 mg taking daily    [provider]  rosuvastatin (CRESTOR) 10 MG tablet TAKE 1 TABLET(10 MG) BY MOUTH DAILY Patient taking differently: Take 5 mg by mouth in the  morning. TAKE 1 TABLET(10 MG) BY MOUTH DAILY 06/12/22   Copland, Gwenlyn Found, MD  tirzepatide (ZEPBOUND) 10 MG/0.5ML Pen Inject 10 mg into the skin once a week. Patient taking differently: Inject 7.5 mg into the skin once a week. 06/04/23   Copland, Gwenlyn Found, MD      Allergies    Patient has no known allergies.    Review of Systems   Review of Systems  Physical Exam Updated Vital Signs BP 119/74   Pulse 77   Temp 98 F (36.7 C) (Oral)   Resp 18   SpO2 100%  Physical Exam Vitals and nursing note reviewed.   Constitutional:      General: He is not in acute distress.    Appearance: He is well-developed. He is not diaphoretic.  HENT:     Head: Normocephalic and atraumatic.  Eyes:     Conjunctiva/sclera: Conjunctivae normal.  Cardiovascular:     Rate and Rhythm: Normal rate and regular rhythm.     Heart sounds: Normal heart sounds. No murmur heard.    No friction rub. No gallop.  Pulmonary:     Effort: Pulmonary effort is normal. No respiratory distress.     Breath sounds: Normal breath sounds. No wheezing or rales.  Abdominal:     General: There is no distension.     Palpations: Abdomen is soft.     Tenderness: There is no abdominal tenderness. There is no guarding.  Musculoskeletal:     Cervical back: Normal range of motion.  Skin:    General: Skin is warm and dry.  Neurological:     Mental Status: He is alert and oriented to person, place, and time.     ED Results / Procedures / Treatments   Labs (all labs ordered are listed, but only abnormal results are displayed) Labs Reviewed  COMPREHENSIVE METABOLIC PANEL - Abnormal; Notable for the following components:      Result Value   Chloride 97 (*)    Glucose, Bld 147 (*)    BUN 25 (*)    Creatinine, Ser 1.99 (*)    Total Bilirubin 1.3 (*)    GFR, Estimated 37 (*)    All other components within normal limits  CBC - Abnormal; Notable for the following components:   MCHC 36.3 (*)    All other components within normal limits  URINALYSIS, ROUTINE W REFLEX MICROSCOPIC - Abnormal; Notable for the following components:   Specific Gravity, Urine 1.039 (*)    Ketones, ur 15 (*)    All other components within normal limits  GASTROINTESTINAL PANEL BY PCR, STOOL (REPLACES STOOL CULTURE)  LIPASE, BLOOD  TROPONIN I (HIGH SENSITIVITY)  TROPONIN I (HIGH SENSITIVITY)    EKG EKG Interpretation Date/Time:  Saturday June 16 2023 13:43:13 EDT Ventricular Rate:  76 PR Interval:  160 QRS Duration:  112 QT Interval:  395 QTC  Calculation: 445 R Axis:   61  Text Interpretation: Sinus rhythm Incomplete right bundle branch block Low voltage, precordial leads No significant change since last tracing Confirmed by Alvira Monday ([id number]) on 06/16/2023 2:58:43 PM  Radiology CT ABDOMEN PELVIS W CONTRAST  Result Date: 06/16/2023 CLINICAL DATA:  Left lower quadrant pain with diarrhea EXAM: CT ABDOMEN AND PELVIS WITH CONTRAST TECHNIQUE: Multidetector CT imaging of the abdomen and pelvis was performed using the standard protocol following bolus administration of intravenous contrast. RADIATION DOSE REDUCTION: This exam was performed according to the departmental dose-optimization program which includes automated exposure control, adjustment of the  mA and/or kV according to patient size and/or use of iterative reconstruction technique. CONTRAST:  OMNIPAQUE IOHEXOL 300 MG/ML  SOLN COMPARISON:  None available FINDINGS: Lower chest: No acute abnormality. Hepatobiliary: No focal liver abnormality is seen. No gallstones, gallbladder wall thickening, or biliary dilatation. Pancreas: Unremarkable. No pancreatic ductal dilatation or surrounding inflammatory changes. Spleen: Normal in size without focal abnormality. Adrenals/Urinary Tract: Adrenal glands are unremarkable. 1 mm nonobstructing calculus at the lower pole the left kidney (image 49, series 5). Kidneys, ureters, and bladder otherwise normal. Stomach/Bowel: No bowel dilatation to indicate ileus or obstruction. Appendix is normal. Diverticulosis of the descending and sigmoid colon without definitive evidence of acute diverticulitis. Vascular/Lymphatic: Calcified atheromatous plaque seen scattered throughout the abdominal aorta and visualized branches. No abdominal aortic aneurysm. No enlarged abdominal or pelvic lymph nodes. Reproductive: Moderately enlarged nodular prostate impressing upon the bladder neck. Other: No abdominal wall hernia or abnormality. No abdominopelvic ascites.  Musculoskeletal: No acute or significant osseous findings. IMPRESSION: 1. No acute abnormality of the abdomen or pelvis. 2. Diverticulosis of the descending and sigmoid colon without evidence of acute diverticulitis. 3. 1 mm nonobstructing left renal calculus. Electronically Signed   By: Acquanetta Belling M.D.   On: 06/16/2023 14:44    Procedures Procedures    Medications Ordered in ED Medications  ondansetron (ZOFRAN) injection 4 mg (4 mg Intravenous Given 06/16/23 1027)  lactated ringers bolus 1,000 mL ( Intravenous Stopped 06/16/23 1354)  iohexol (OMNIPAQUE) 300 MG/ML solution 100 mL (100 mLs Intravenous Contrast Given 06/16/23 1324)  pantoprazole (PROTONIX) injection 40 mg (40 mg Intravenous Given 06/16/23 1335)    ED Course/ Medical Decision Making/ A&P                                  62 year old male with a history of hypertension, dyslipidemia, mediastinal lymphadenopathy, depression, elevated coronary calcium score, who was seen in the emergency department 4 days ago with concern for nausea, vomiting and diarrhea and returns today with continued nausea, vomiting and diarrhea.   DDx includes diverticulitis, colitis, gastroenteritis, mesenteric ischemia, gallbladder pathology, obstruction.  CT completed and personally evaluated by me and radiology shows no acute abnormalities, no sign of colitis, cholecystitis nor diverticulitis.  Labs completed and evaluated by me show Cr from 1.75 days ago (1.1 10/7) to a 1.99.  No clinically significant electrolyte abnormalities, no leukocytosis, normal lipase, no UTI.  Has reflux, ECG evaluated by me, troponin negative x2 so no sign ACS.    Duration of symptoms still in window for infectious diarrhea. Discussed recommend continued supportive care, hydration, bland diet. Rx for zofran, PPI.  GI pathogen panel pending.  Will need recheck labs early this week. Consider GI follow up if symptoms continue.Patient discharged in stable condition with  understanding of reasons to return.         Final Clinical Impression(s) / ED Diagnoses Final diagnoses:  Nausea vomiting and diarrhea  AKI (acute kidney injury) (HCC)  Dehydration    Rx / DC Orders ED Discharge Orders          Ordered    pantoprazole (PROTONIX) 40 MG tablet  Daily        06/16/23 1624    ondansetron (ZOFRAN-ODT) 4 MG disintegrating tablet        06/16/23 1624              Alvira Monday, MD 06/16/23 2346

## 2023-06-16 NOTE — ED Triage Notes (Signed)
Recent visit for same. N/V/D x6 days that began Tuesday, unable to tolerate PO other than water, has not been able to take daily medications this week. Generalized weakness.  x2 emesis occurrences, 7-12 diarrhea occurrences in the past 24 hours.   Family hx gallbladder issues.

## 2023-06-17 ENCOUNTER — Encounter: Payer: Self-pay | Admitting: Internal Medicine

## 2023-06-17 ENCOUNTER — Other Ambulatory Visit: Payer: Self-pay

## 2023-06-17 DIAGNOSIS — R197 Diarrhea, unspecified: Secondary | ICD-10-CM | POA: Insufficient documentation

## 2023-06-17 DIAGNOSIS — Z79899 Other long term (current) drug therapy: Secondary | ICD-10-CM | POA: Diagnosis not present

## 2023-06-17 DIAGNOSIS — I10 Essential (primary) hypertension: Secondary | ICD-10-CM | POA: Insufficient documentation

## 2023-06-17 DIAGNOSIS — Z7982 Long term (current) use of aspirin: Secondary | ICD-10-CM | POA: Diagnosis not present

## 2023-06-17 DIAGNOSIS — R1110 Vomiting, unspecified: Secondary | ICD-10-CM | POA: Diagnosis not present

## 2023-06-17 LAB — GASTROINTESTINAL PANEL BY PCR, STOOL (REPLACES STOOL CULTURE)

## 2023-06-18 ENCOUNTER — Emergency Department (HOSPITAL_BASED_OUTPATIENT_CLINIC_OR_DEPARTMENT_OTHER)
Admission: EM | Admit: 2023-06-18 | Discharge: 2023-06-18 | Disposition: A | Discharge status: Home / Self Care | Payer: BC Managed Care – PPO | Attending: Emergency Medicine | Admitting: Emergency Medicine

## 2023-06-18 DIAGNOSIS — A0811 Acute gastroenteropathy due to Norwalk agent: Secondary | ICD-10-CM

## 2023-06-18 DIAGNOSIS — R197 Diarrhea, unspecified: Secondary | ICD-10-CM

## 2023-06-18 LAB — CBC
HCT: 42.4 % (ref 39.0–52.0)
Hemoglobin: 15.3 g/dL (ref 13.0–17.0)
MCH: 31.6 pg (ref 26.0–34.0)
MCHC: 36.1 g/dL — ABNORMAL HIGH (ref 30.0–36.0)
MCV: 87.6 fL (ref 80.0–100.0)
Platelets: 301 10*3/uL (ref 150–400)
RBC: 4.84 MIL/uL (ref 4.22–5.81)
RDW: 13.1 % (ref 11.5–15.5)
WBC: 6.8 10*3/uL (ref 4.0–10.5)
nRBC: 0 % (ref 0.0–0.2)

## 2023-06-18 LAB — COMPREHENSIVE METABOLIC PANEL
ALT: 16 U/L (ref 0–44)
AST: 16 U/L (ref 15–41)
Albumin: 4.6 g/dL (ref 3.5–5.0)
Alkaline Phosphatase: 66 U/L (ref 38–126)
Anion gap: 13 (ref 5–15)
BUN: 17 mg/dL (ref 8–23)
CO2: 25 mmol/L (ref 22–32)
Calcium: 9.8 mg/dL (ref 8.9–10.3)
Chloride: 96 mmol/L — ABNORMAL LOW (ref 98–111)
Creatinine, Ser: 1.54 mg/dL — ABNORMAL HIGH (ref 0.61–1.24)
GFR, Estimated: 50 mL/min — ABNORMAL LOW (ref >60)
Glucose, Bld: 118 mg/dL — ABNORMAL HIGH (ref 70–99)
Potassium: 2.8 mmol/L — ABNORMAL LOW (ref 3.5–5.1)
Sodium: 134 mmol/L — ABNORMAL LOW (ref 135–145)
Total Bilirubin: 1.3 mg/dL — ABNORMAL HIGH (ref 0.3–1.2)
Total Protein: 7.5 g/dL (ref 6.5–8.1)

## 2023-06-18 LAB — LIPASE, BLOOD: Lipase: 34 U/L (ref 11–51)

## 2023-06-18 MED ORDER — DIPHENOXYLATE-ATROPINE 2.5-0.025 MG PO TABS: 2.0000 | ORAL_TABLET | Freq: Four times a day (QID) | ORAL | 0 refills | Status: DC | PRN

## 2023-06-18 MED ORDER — SODIUM CHLORIDE 0.9 % IV BOLUS
1000.0000 mL | Freq: Once | INTRAVENOUS | Status: AC
Start: 2023-06-18 — End: 2023-06-18
  Administered 2023-06-18: 1000 mL via INTRAVENOUS

## 2023-06-18 MED ORDER — DIPHENOXYLATE-ATROPINE 2.5-0.025 MG PO TABS
1.0000 | ORAL_TABLET | Freq: Once | ORAL | Status: AC
  Administered 2023-06-18: 1 via ORAL
  Filled 2023-06-18: qty 1

## 2023-06-18 NOTE — Telephone Encounter (Signed)
Pt is currently in the ER. 

## 2023-06-18 NOTE — ED Notes (Signed)
ED Provider at bedside. 

## 2023-06-18 NOTE — ED Provider Notes (Signed)
[facility] EMERGENCY DEPARTMENT AT [facility] Provider Note   CSN: [id number] Arrival date & time: 06/17/23  2328     History  Chief Complaint  Patient presents with   Diarrhea    Henry Wallace is a 62 y.o. male.  Patient is a 62 year old male with past medical history of hypertension, hyperlipidemia.  Patient presenting with complaints of diarrhea.  This has been ongoing for the past 5 or 6 days.  He has been seen in the ER on 2 prior occasions with the same complaint.  He was given IV fluids and found to have a bump in his creatinine to 1.99.  Stool cultures from 2 days ago revealed norovirus.  Patient presents today with ongoing diarrhea.  Every time he eats or drinks it causes him to have additional episodes.  Diarrhea has been nonbloody.  He has had some episodes of vomiting as well.  He denies abdominal pain.  CT scan performed in the ER on his last visit unremarkable.  The history is provided by the patient.       Home Medications Prior to Admission medications   Medication Sig Start Date End Date Taking? Authorizing Provider  5-Hydroxytryptophan (5-HTP) 100 MG CAPS Take 200 mg by mouth every evening.    [provider]  acetaminophen (TYLENOL) 500 MG tablet Take 500 mg by mouth every 6 (six) hours as needed for mild pain (pain score 1-3) or moderate pain (pain score 4-6).    [provider]  amLODipine (NORVASC) 5 MG tablet Take 1 tablet (5 mg total) by mouth daily. Patient taking differently: Take 5 mg by mouth at bedtime. 06/04/23   Copland, Gwenlyn Found, MD  aspirin 81 MG chewable tablet Chew by mouth daily.    [provider]  citalopram (CELEXA) 40 MG tablet Take 1 tablet (40 mg total) by mouth daily. Patient taking differently: Take 20 mg by mouth in the morning. 06/04/23   Copland, Gwenlyn Found, MD  hydrochlorothiazide (HYDRODIURIL) 25 MG tablet Take 1 tablet (25 mg total) by mouth daily. 06/04/23   Copland, Gwenlyn Found, MD  IBUPROFEN PO  Take 2-4 tablets by mouth daily as needed (pain).    [provider]  loperamide (IMODIUM A-D) 2 MG tablet Take 2-4 mg by mouth 4 (four) times daily as needed for diarrhea or loose stools.    [provider]  multivitamin-iron-minerals-folic acid (CENTRUM) chewable tablet Chew 1 tablet by mouth daily.    [provider]  ondansetron (ZOFRAN-ODT) 4 MG disintegrating tablet 4mg  ODT q4 hours prn nausea/vomit 06/16/23   Benjiman Core, MD  OVER THE COUNTER MEDICATION Take 5,000 Units by mouth in the morning. Vitamin D 3 5000 mg taking daily    [provider]  pantoprazole (PROTONIX) 40 MG tablet Take 1 tablet (40 mg total) by mouth daily. 06/16/23   Benjiman Core, MD  rosuvastatin (CRESTOR) 10 MG tablet TAKE 1 TABLET(10 MG) BY MOUTH DAILY Patient taking differently: Take 5 mg by mouth in the morning. TAKE 1 TABLET(10 MG) BY MOUTH DAILY 06/12/22   Copland, Gwenlyn Found, MD  tirzepatide (ZEPBOUND) 10 MG/0.5ML Pen Inject 10 mg into the skin once a week. Patient taking differently: Inject 7.5 mg into the skin once a week. 06/04/23   Copland, Gwenlyn Found, MD      Allergies    Patient has no known allergies.    Review of Systems   Review of Systems  All other systems reviewed and are negative.  Physical Exam Updated Vital Signs BP 123/88 (BP Location: Left Arm)   Pulse (!) 106   Temp 99.3 F (37.4 C) (Oral)   Resp 18   Wt 97.5 kg   SpO2 100%   BMI 31.74 kg/m  Physical Exam Vitals and nursing note reviewed.  Constitutional:      General: He is not in acute distress.    Appearance: He is well-developed. He is not diaphoretic.  HENT:     Head: Normocephalic and atraumatic.  Cardiovascular:     Rate and Rhythm: Normal rate and regular rhythm.     Heart sounds: No murmur heard.    No friction rub.  Pulmonary:     Effort: Pulmonary effort is normal. No respiratory distress.     Breath sounds: Normal breath sounds. No wheezing or rales.  Abdominal:      General: Bowel sounds are normal. There is no distension.     Palpations: Abdomen is soft.     Tenderness: There is no abdominal tenderness.  Musculoskeletal:        General: Normal range of motion.     Cervical back: Normal range of motion and neck supple.  Skin:    General: Skin is warm and dry.  Neurological:     Mental Status: He is alert and oriented to person, place, and time.     Coordination: Coordination normal.     ED Results / Procedures / Treatments   Labs (all labs ordered are listed, but only abnormal results are displayed) Labs Reviewed  CBC - Abnormal; Notable for the following components:      Result Value   MCHC 36.1 (*)    All other components within normal limits  LIPASE, BLOOD  COMPREHENSIVE METABOLIC PANEL  URINALYSIS, ROUTINE W REFLEX MICROSCOPIC    EKG None  Radiology CT ABDOMEN PELVIS W CONTRAST  Result Date: 06/16/2023 CLINICAL DATA:  Left lower quadrant pain with diarrhea EXAM: CT ABDOMEN AND PELVIS WITH CONTRAST TECHNIQUE: Multidetector CT imaging of the abdomen and pelvis was performed using the standard protocol following bolus administration of intravenous contrast. RADIATION DOSE REDUCTION: This exam was performed according to the departmental dose-optimization program which includes automated exposure control, adjustment of the mA and/or kV according to patient size and/or use of iterative reconstruction technique. CONTRAST:  OMNIPAQUE IOHEXOL 300 MG/ML  SOLN COMPARISON:  None available FINDINGS: Lower chest: No acute abnormality. Hepatobiliary: No focal liver abnormality is seen. No gallstones, gallbladder wall thickening, or biliary dilatation. Pancreas: Unremarkable. No pancreatic ductal dilatation or surrounding inflammatory changes. Spleen: Normal in size without focal abnormality. Adrenals/Urinary Tract: Adrenal glands are unremarkable. 1 mm nonobstructing calculus at the lower pole the left kidney (image 49, series 5). Kidneys,  ureters, and bladder otherwise normal. Stomach/Bowel: No bowel dilatation to indicate ileus or obstruction. Appendix is normal. Diverticulosis of the descending and sigmoid colon without definitive evidence of acute diverticulitis. Vascular/Lymphatic: Calcified atheromatous plaque seen scattered throughout the abdominal aorta and visualized branches. No abdominal aortic aneurysm. No enlarged abdominal or pelvic lymph nodes. Reproductive: Moderately enlarged nodular prostate impressing upon the bladder neck. Other: No abdominal wall hernia or abnormality. No abdominopelvic ascites. Musculoskeletal: No acute or significant osseous findings. IMPRESSION: 1. No acute abnormality of the abdomen or pelvis. 2. Diverticulosis of the descending and sigmoid colon without evidence of acute diverticulitis. 3. 1 mm nonobstructing left renal calculus. Electronically Signed   By: Acquanetta Belling M.D.   On: 06/16/2023 14:44    Procedures Procedures  Medications Ordered in ED Medications  diphenoxylate-atropine (LOMOTIL) 2.5-0.025 MG per tablet 1 tablet (has no administration in time range)  sodium chloride 0.9 % bolus 1,000 mL (has no administration in time range)    ED Course/ Medical Decision Making/ A&P  Patient is a 62 year old male presenting with diarrhea as described in the HPI.  This has been his third visit this week with these complaints.  A stool specimen that was analyzed at his last visit has tested positive for norovirus.  Patient arrives here with stable vital signs and is afebrile.  Abdomen is benign.  Workup initiated including CBC, CMP, and lipase.  Creatinine is 1.5 improved from 1.99.  LFTs and CBC basically unremarkable.  Patient has been given IV fluids along with a dose of Lomotil and seems to be feeling better.  At this point, symptoms are viral and I see no indication for antibiotics.  I will prescribe Lomotil for him to take at home and have him follow-up as needed.  Final Clinical  Impression(s) / ED Diagnoses Final diagnoses:  None    Rx / DC Orders ED Discharge Orders     None         Geoffery Lyons, MD 06/18/23 [phone]

## 2023-06-18 NOTE — Discharge Instructions (Signed)
Begin taking Lomotil as prescribed as needed for diarrhea.  Follow-up with your primary doctor if not improving in the next few days, and return to the ER if symptoms worsen, you develop bloody stools, high fevers, or for other new and concerning symptoms.

## 2023-06-18 NOTE — ED Triage Notes (Signed)
PT to triage c/o Diarrhea x 7 days. Pt seen for same earlier and states he feels like he's getting worse. PT denies pain, fever at this time. VSS NAD PT on room air.

## 2023-06-19 NOTE — Progress Notes (Unsigned)
[facility] [address], [address] [place], [place] [address] [phone]  Date:  06/20/2023   Name:  Henry Wallace   DOB:  [birth year]   MRN:  [id number]  PCP:  Pearline Cables, MD    Chief Complaint: No chief complaint on file.   History of Present Illness:  Henry Wallace is a 62 y.o. very pleasant male patient who presents with the following:  Patient seen today to follow-up recent bout with norovirus Actually saw him for his physical recently, earlier this month.  At that time he was doing fine-history of hypertension, dyslipidemia, ocular migraine 2023, elevated coronary calcium score He went to the ER 3 times, 10/15, 10/19, 10/21.  His stool did test positive for norovirus and a sample dated 10/19 His most recent labs did show hypokalemia which should be followed up today Patient Active Problem List   Diagnosis Date Noted   Hyperlipidemia 07/05/2022   Elevated coronary artery calcium score 07/05/2022   Carotid stenosis, asymptomatic, bilateral 10/18/2021   MDD (major depressive disorder), recurrent, in full remission (HCC) 11/04/2015   OBESITY, UNSPECIFIED 07/06/2009   HYPERTENSION, BENIGN 07/06/2009   CHEST PAIN, PRECORDIAL 07/06/2009    Past Medical History:  Diagnosis Date   Adenomatous colon polyp    Coronary artery disease    Diverticulosis    Hepatic steatosis    Hypertension    Migraines     Past Surgical History:  Procedure Laterality Date   ADENOIDECTOMY     ANKLE SURGERY Left 1994   HERNIA REPAIR     PROSTATE SURGERY     VASECTOMY      Social History   Tobacco Use   Smoking status: Former    Types: Cigarettes   Smokeless tobacco: Never  Substance Use Topics   Alcohol use: Not Currently    Comment: occ   Drug use: No    Family History  Problem Relation Age of Onset   Diverticulitis Mother    Diabetes Maternal Grandmother    Lung cancer Maternal Grandfather    Heart disease  Paternal Grandfather    Colon cancer Neg Hx    Stomach cancer Neg Hx    Esophageal cancer Neg Hx     No Known Allergies  Medication list has been reviewed and updated.  Current Outpatient Medications on File Prior to Visit  Medication Sig Dispense Refill   5-Hydroxytryptophan (5-HTP) 100 MG CAPS Take 200 mg by mouth every evening.     acetaminophen (TYLENOL) 500 MG tablet Take 500 mg by mouth every 6 (six) hours as needed for mild pain (pain score 1-3) or moderate pain (pain score 4-6).     amLODipine (NORVASC) 5 MG tablet Take 1 tablet (5 mg total) by mouth daily. (Patient taking differently: Take 5 mg by mouth at bedtime.) 90 tablet 3   aspirin 81 MG chewable tablet Chew by mouth daily.     citalopram (CELEXA) 40 MG tablet Take 1 tablet (40 mg total) by mouth daily. (Patient taking differently: Take 20 mg by mouth in the morning.) 90 tablet 3   diphenoxylate-atropine (LOMOTIL) 2.5-0.025 MG tablet Take 2 tablets by mouth 4 (four) times daily as needed for diarrhea or loose stools. 30 tablet 0   hydrochlorothiazide (HYDRODIURIL) 25 MG tablet Take 1 tablet (25 mg total) by mouth daily. 90 tablet 3   IBUPROFEN PO Take 2-4 tablets by mouth daily as needed (pain).  loperamide (IMODIUM A-D) 2 MG tablet Take 2-4 mg by mouth 4 (four) times daily as needed for diarrhea or loose stools.     multivitamin-iron-minerals-folic acid (CENTRUM) chewable tablet Chew 1 tablet by mouth daily.     ondansetron (ZOFRAN-ODT) 4 MG disintegrating tablet 4mg  ODT q4 hours prn nausea/vomit 20 tablet 0   OVER THE COUNTER MEDICATION Take 5,000 Units by mouth in the morning. Vitamin D 3 5000 mg taking daily     pantoprazole (PROTONIX) 40 MG tablet Take 1 tablet (40 mg total) by mouth daily. 14 tablet 0   rosuvastatin (CRESTOR) 10 MG tablet TAKE 1 TABLET(10 MG) BY MOUTH DAILY (Patient taking differently: Take 5 mg by mouth in the morning. TAKE 1 TABLET(10 MG) BY MOUTH DAILY) 90 tablet 3   tirzepatide (ZEPBOUND) 10  MG/0.5ML Pen Inject 10 mg into the skin once a week. (Patient taking differently: Inject 7.5 mg into the skin once a week.) 4 mL 1   No current facility-administered medications on file prior to visit.    Review of Systems:  As per HPI- otherwise negative.   Physical Examination: There were no vitals filed for this visit. There were no vitals filed for this visit. There is no height or weight on file to calculate BMI. Ideal Body Weight:    GEN: no acute distress. HEENT: Atraumatic, Normocephalic.  Ears and Nose: No external deformity. CV: RRR, No M/G/R. No JVD. No thrill. No extra heart sounds. PULM: CTA B, no wheezes, crackles, rhonchi. No retractions. No resp. distress. No accessory muscle use. ABD: S, NT, ND, +BS. No rebound. No HSM. EXTR: No c/c/e PSYCH: Normally interactive. Conversant.    Assessment and Plan: ***  Signed Abbe Amsterdam, MD

## 2023-06-20 ENCOUNTER — Encounter: Payer: Self-pay | Admitting: Urology

## 2023-06-20 ENCOUNTER — Ambulatory Visit (INDEPENDENT_AMBULATORY_CARE_PROVIDER_SITE_OTHER): Payer: BC Managed Care – PPO | Admitting: Family Medicine

## 2023-06-20 ENCOUNTER — Ambulatory Visit: Payer: BC Managed Care – PPO | Admitting: Urology

## 2023-06-20 VITALS — BP 118/78 | HR 80 | Temp 98.0°F | Resp 18 | Ht 69.0 in | Wt 201.5 lb

## 2023-06-20 VITALS — BP 109/74 | HR 79 | Ht 70.0 in | Wt 201.0 lb

## 2023-06-20 DIAGNOSIS — E876 Hypokalemia: Secondary | ICD-10-CM

## 2023-06-20 DIAGNOSIS — R972 Elevated prostate specific antigen [PSA]: Secondary | ICD-10-CM

## 2023-06-20 DIAGNOSIS — N5319 Other ejaculatory dysfunction: Secondary | ICD-10-CM

## 2023-06-20 DIAGNOSIS — A0811 Acute gastroenteropathy due to Norwalk agent: Secondary | ICD-10-CM

## 2023-06-20 MED ORDER — LEVOFLOXACIN 750 MG PO TABS: 750.0000 mg | ORAL_TABLET | Freq: Every day | ORAL | 0 refills | Status: AC

## 2023-06-20 NOTE — Progress Notes (Signed)
H&P  Chief Complaint: Elevated PSA  History of Present Illness: This healthy 62 year old male comes in today with his wife for evaluation and management of elevating PSA trend.  His most recent PSA checked within the past few weeks is 5.2.  There has been a steady increase over the past 2 to 3 years, prior to that his baseline normal was approximately 1.5.  There is a family history of prostate cancer.  His father underwent prostatectomy at the age of 68.  He does have mild LUTS-IPSS 8/1.  He does experience delayed ejaculation.  He is on Celexa 40 mg a day, increased from 20 mg sometime ago.  He denies significant problems obtaining or maintaining erections.  Past Medical History:  Diagnosis Date   Adenomatous colon polyp    Coronary artery disease    Diverticulosis    Hepatic steatosis    Hypertension    Migraines     Past Surgical History:  Procedure Laterality Date   ADENOIDECTOMY     ANKLE SURGERY Left 1994   HERNIA REPAIR     PROSTATE SURGERY     VASECTOMY      Home Medications:  Allergies as of 06/20/2023   No Known Allergies      Medication List        Accurate as of June 20, 2023  7:30 AM. If you have any questions, ask your nurse or doctor.          5-HTP 100 MG Caps Take 200 mg by mouth every evening.   acetaminophen 500 MG tablet Commonly known as: TYLENOL Take 500 mg by mouth every 6 (six) hours as needed for mild pain (pain score 1-3) or moderate pain (pain score 4-6).   amLODipine 5 MG tablet Commonly known as: NORVASC Take 1 tablet (5 mg total) by mouth daily. What changed: when to take this   aspirin 81 MG chewable tablet Chew by mouth daily.   citalopram 40 MG tablet Commonly known as: CELEXA Take 1 tablet (40 mg total) by mouth daily. What changed:  how much to take when to take this   diphenoxylate-atropine 2.5-0.025 MG tablet Commonly known as: Lomotil Take 2 tablets by mouth 4 (four) times daily as needed for diarrhea or  loose stools.   hydrochlorothiazide 25 MG tablet Commonly known as: HYDRODIURIL Take 1 tablet (25 mg total) by mouth daily.   IBUPROFEN PO Take 2-4 tablets by mouth daily as needed (pain).   loperamide 2 MG tablet Commonly known as: IMODIUM A-D Take 2-4 mg by mouth 4 (four) times daily as needed for diarrhea or loose stools.   multivitamin-iron-minerals-folic acid chewable tablet Chew 1 tablet by mouth daily.   ondansetron 4 MG disintegrating tablet Commonly known as: ZOFRAN-ODT 4mg  ODT q4 hours prn nausea/vomit   OVER THE COUNTER MEDICATION Take 5,000 Units by mouth in the morning. Vitamin D 3 5000 mg taking daily   pantoprazole 40 MG tablet Commonly known as: Protonix Take 1 tablet (40 mg total) by mouth daily.   rosuvastatin 10 MG tablet Commonly known as: CRESTOR TAKE 1 TABLET(10 MG) BY MOUTH DAILY What changed:  how much to take how to take this when to take this   Zepbound 10 MG/0.5ML Pen Generic drug: tirzepatide Inject 10 mg into the skin once a week. What changed: how much to take        Allergies: No Known Allergies  Family History  Problem Relation Age of Onset   Diverticulitis Mother  Diabetes Maternal Grandmother    Lung cancer Maternal Grandfather    Heart disease Paternal Grandfather    Colon cancer Neg Hx    Stomach cancer Neg Hx    Esophageal cancer Neg Hx     Social History:  reports that he has quit smoking. His smoking use included cigarettes. He has never used smokeless tobacco. He reports that he does not currently use alcohol. He reports that he does not use drugs.  ROS: A complete review of systems was performed.  All systems are negative except for pertinent findings as noted.  Physical Exam:  Vital signs in last 24 hours: There were no vitals taken for this visit. Constitutional:  Alert and oriented, No acute distress Cardiovascular: Regular rate  Respiratory: Normal respiratory effort GI: No inguinal  hernias Genitourinary: Normal male phallus, testes are descended bilaterally and non-tender and without masses, scrotum is normal in appearance without lesions or masses, perineum is normal on inspection.  Normal anal sphincter tone.  Prostate is 40 g, symmetric, nonnodular, nontender.  No rectal masses. Lymphatic: No lymphadenopathy Neurologic: Grossly intact, no focal deficits Psychiatric: Normal mood and affect   I have reviewed notes from referring/previous physicians  I have reviewed urinalysis results  I have independently reviewed prior imaging--recent CT images reviewed.  Estimated prostate volume 69 mL.  I have reviewed prior PSA results  I have reviewed prior testosterone levels, all within normal limits  IPSS sheet reviewed   Impression/Assessment:  1.  Elevating PSA trend in a healthy 62 year old male with family history of prostate cancer.  Benign but somewhat large prostate on exam today  2.  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  I discussed transrectal ultrasound and biopsy with the patient and his wife.  I suggest that we proceed with that.  Risks and complications discussed.  I will have Dr. Margo Aye perform that  2.  I did let them know that I think the Celexa may be causing the ejaculatory delay.  As he has less stress than he did when he increased the dose from 20 to 40 mg, he will try to back down to 20 mg a day

## 2023-06-20 NOTE — Patient Instructions (Signed)
Prostate Biopsy Instructions  Stop all aspirin or blood thinners (aspirin, plavix, coumadin, warfarin, motrin, ibuprofen, advil, aleve, naproxen, naprosyn) for 7 days prior to the procedure.  If you have any questions about stopping these medications, please contact your primary care physician or cardiologist.  Having a light meal prior to the procedure is recommended.  If you are diabetic or have low blood sugar please bring a small snack or glucose tablet.  A Fleets enema is needed and can be purchased over the counter at a local pharmacy. This will need to be administered 2 hours prior to your procedure. Antibiotics will be administered in the clinic at the time of the procedure unless otherwise specified.    If you have any questions or concerns, please feel free to call the office at [phone] or send a Mychart message.   Thank you,  Staff at [facility]  

## 2023-06-20 NOTE — Patient Instructions (Addendum)
It was good to see you today- I will be in touch with your labs asap I am glad you are feeling better!  Let me know if symptoms should return

## 2023-06-21 ENCOUNTER — Encounter: Payer: Self-pay | Admitting: Family Medicine

## 2023-06-21 LAB — URINALYSIS, ROUTINE W REFLEX MICROSCOPIC
Bilirubin, UA: NEGATIVE
Glucose, UA: NEGATIVE
Leukocytes,UA: NEGATIVE
Nitrite, UA: NEGATIVE
Protein,UA: NEGATIVE
RBC, UA: NEGATIVE
Specific Gravity, UA: 1.015 (ref 1.005–1.030)
Urobilinogen, Ur: 0.2 mg/dL (ref 0.2–1.0)
pH, UA: 6 (ref 5.0–7.5)

## 2023-06-21 LAB — COMPREHENSIVE METABOLIC PANEL
ALT: 12 U/L (ref 0–53)
AST: 17 U/L (ref 0–37)
Albumin: 4.1 g/dL (ref 3.5–5.2)
Alkaline Phosphatase: 72 U/L (ref 39–117)
BUN: 9 mg/dL (ref 6–23)
CO2: 33 meq/L — ABNORMAL HIGH (ref 19–32)
Calcium: 9.3 mg/dL (ref 8.4–10.5)
Chloride: 97 meq/L (ref 96–112)
Creatinine, Ser: 1.29 mg/dL (ref 0.40–1.50)
GFR: 58.83 mL/min — ABNORMAL LOW (ref >60.00)
Glucose, Bld: 114 mg/dL — ABNORMAL HIGH (ref 70–99)
Potassium: 3.1 meq/L — ABNORMAL LOW (ref 3.5–5.1)
Sodium: 137 meq/L (ref 135–145)
Total Bilirubin: 0.8 mg/dL (ref 0.2–1.2)
Total Protein: 6.3 g/dL (ref 6.0–8.3)

## 2023-06-26 ENCOUNTER — Encounter: Payer: Self-pay | Admitting: Family Medicine

## 2023-06-27 ENCOUNTER — Ambulatory Visit: Payer: BC Managed Care – PPO | Admitting: Family Medicine

## 2023-06-27 ENCOUNTER — Encounter: Payer: Self-pay | Admitting: Family Medicine

## 2023-06-27 VITALS — BP 105/70 | HR 70 | Temp 98.1°F | Ht 69.0 in

## 2023-06-27 DIAGNOSIS — A09 Infectious gastroenteritis and colitis, unspecified: Secondary | ICD-10-CM | POA: Diagnosis not present

## 2023-06-27 DIAGNOSIS — R112 Nausea with vomiting, unspecified: Secondary | ICD-10-CM

## 2023-06-27 LAB — CBC
HCT: 40.5 % (ref 39.0–52.0)
Hemoglobin: 13.9 g/dL (ref 13.0–17.0)
MCHC: 34.3 g/dL (ref 30.0–36.0)
MCV: 92 fL (ref 78.0–100.0)
Platelets: 338 10*3/uL (ref 150.0–400.0)
RBC: 4.4 Mil/uL (ref 4.22–5.81)
RDW: 13.5 % (ref 11.5–15.5)
WBC: 9.4 10*3/uL (ref 4.0–10.5)

## 2023-06-27 LAB — COMPREHENSIVE METABOLIC PANEL
ALT: 21 U/L (ref 0–53)
AST: 15 U/L (ref 0–37)
Albumin: 4.1 g/dL (ref 3.5–5.2)
Alkaline Phosphatase: 71 U/L (ref 39–117)
BUN: 17 mg/dL (ref 6–23)
CO2: 31 meq/L (ref 19–32)
Calcium: 9.1 mg/dL (ref 8.4–10.5)
Chloride: 100 meq/L (ref 96–112)
Creatinine, Ser: 1.36 mg/dL (ref 0.40–1.50)
GFR: 55.21 mL/min — ABNORMAL LOW (ref >60.00)
Glucose, Bld: 101 mg/dL — ABNORMAL HIGH (ref 70–99)
Potassium: 4.1 meq/L (ref 3.5–5.1)
Sodium: 137 meq/L (ref 135–145)
Total Bilirubin: 0.8 mg/dL (ref 0.2–1.2)
Total Protein: 7 g/dL (ref 6.0–8.3)

## 2023-06-27 LAB — LIPASE: Lipase: 36 U/L (ref 11.0–59.0)

## 2023-06-27 MED ORDER — DIPHENOXYLATE-ATROPINE 2.5-0.025 MG PO TABS: 2.0000 | ORAL_TABLET | Freq: Four times a day (QID) | ORAL | 0 refills | Status: DC | PRN

## 2023-06-27 NOTE — Progress Notes (Signed)
[facility] [address] [place], [place] [address] [phone]  Date:  06/27/2023   Name:  Henry Wallace   DOB:  1961-06-07   MRN:  [id number]  PCP:  Pearline Cables, MD    Chief Complaint: Diarrhea (Day 18)   History of Present Illness:  Henry Wallace is a 62 y.o. very pleasant male patient who presents with the following:  Patient seen today for follow-up.  He recently had a prolonged bout with norovirus, was in the ER 3 times-October 15, 19th, 21st for IV fluids.  I saw him in follow-up on 10/23 at which point he was doing better.  However, patient contacted me yesterday with return of diarrhea-they are afraid his symptoms are coming back  A stool panel collected on 10/19 was positive for norovirus, otherwise negative Abdominal pelvis CT 10/19 also negative  Diarrhea started again yesterday- they are using lomotil which does help suppress symptoms but does not entirely control diarrhea 1 episode of multiple vomiting yesterday- no vomiting today Was able to eat a little rice yesterday and today and is drinking liquids  No fever  His wife continues to be fine, she has not had these symptoms They have not done much since he has been sick, have not been out really or around anyone sick that they are aware of He has been eating very limited foods, nothing suspicious  Wt Readings from Last 3 Encounters:  06/20/23 201 lb 8 oz (91.4 kg)  06/20/23 201 lb (91.2 kg)  06/18/23 214 lb 15.2 oz (97.5 kg)   BP Readings from Last 3 Encounters:  06/27/23 105/70  06/20/23 118/78  06/20/23 109/74   d  Patient Active Problem List   Diagnosis Date Noted   Hyperlipidemia 07/05/2022   Elevated coronary artery calcium score 07/05/2022   Carotid stenosis, asymptomatic, bilateral 10/18/2021   MDD (major depressive disorder), recurrent, in full remission (HCC) 11/04/2015   OBESITY, UNSPECIFIED 07/06/2009   HYPERTENSION,  BENIGN 07/06/2009   CHEST PAIN, PRECORDIAL 07/06/2009    Past Medical History:  Diagnosis Date   Adenomatous colon polyp    Coronary artery disease    Diverticulosis    Hepatic steatosis    Hypertension    Migraines     Past Surgical History:  Procedure Laterality Date   ADENOIDECTOMY     ANKLE SURGERY Left 1994   HERNIA REPAIR     PROSTATE SURGERY     VASECTOMY      Social History   Tobacco Use   Smoking status: Former    Types: Cigarettes   Smokeless tobacco: Never  Substance Use Topics   Alcohol use: Not Currently    Comment: occ   Drug use: No    Family History  Problem Relation Age of Onset   Diverticulitis Mother    Diabetes Maternal Grandmother    Lung cancer Maternal Grandfather    Heart disease Paternal Grandfather    Colon cancer Neg Hx    Stomach cancer Neg Hx    Esophageal cancer Neg Hx     No Known Allergies  Medication list has been reviewed and updated.  Current Outpatient Medications on File Prior to Visit  Medication Sig Dispense Refill   5-Hydroxytryptophan (5-HTP) 100 MG CAPS Take 200 mg by mouth every evening.     acetaminophen (TYLENOL) 500 MG tablet Take 500 mg by mouth every 6 (six) hours as needed for mild pain (  pain score 1-3) or moderate pain (pain score 4-6).     amLODipine (NORVASC) 5 MG tablet Take 1 tablet (5 mg total) by mouth daily. (Patient taking differently: Take 5 mg by mouth at bedtime.) 90 tablet 3   aspirin 81 MG chewable tablet Chew by mouth daily.     citalopram (CELEXA) 40 MG tablet Take 1 tablet (40 mg total) by mouth daily. (Patient taking differently: Take 20 mg by mouth in the morning.) 90 tablet 3   hydrochlorothiazide (HYDRODIURIL) 25 MG tablet Take 1 tablet (25 mg total) by mouth daily. 90 tablet 3   IBUPROFEN PO Take 2-4 tablets by mouth daily as needed (pain).     multivitamin-iron-minerals-folic acid (CENTRUM) chewable tablet Chew 1 tablet by mouth daily.     ondansetron (ZOFRAN-ODT) 4 MG disintegrating  tablet 4mg  ODT q4 hours prn nausea/vomit 20 tablet 0   OVER THE COUNTER MEDICATION Take 5,000 Units by mouth in the morning. Vitamin D 3 5000 mg taking daily     pantoprazole (PROTONIX) 40 MG tablet Take 1 tablet (40 mg total) by mouth daily. 14 tablet 0   rosuvastatin (CRESTOR) 10 MG tablet TAKE 1 TABLET(10 MG) BY MOUTH DAILY (Patient taking differently: Take 5 mg by mouth in the morning. TAKE 1 TABLET(10 MG) BY MOUTH DAILY) 90 tablet 3   tirzepatide (ZEPBOUND) 10 MG/0.5ML Pen Inject 10 mg into the skin once a week. (Patient taking differently: Inject 7.5 mg into the skin once a week.) 4 mL 1   loperamide (IMODIUM A-D) 2 MG tablet Take 2-4 mg by mouth 4 (four) times daily as needed for diarrhea or loose stools. (Patient not taking: Reported on 06/27/2023)     No current facility-administered medications on file prior to visit.    Review of Systems:  As per HPI- otherwise negative.   Physical Examination: Vitals:   06/27/23 1426  BP: 105/70  Pulse: 70  Temp: 98.1 F (36.7 C)   Vitals:   06/27/23 1426  Height: 5\' 9"  (1.753 m)   Body mass index is 29.76 kg/m. Ideal Body Weight: Weight in (lb) to have BMI = 25: 168.9  GEN: no acute distress.  Nontoxic, has lost weight recently due to GLP-1 use and GI illness HEENT: Atraumatic, Normocephalic.  Ears and Nose: No external deformity. CV: RRR, No M/G/R. No JVD. No thrill. No extra heart sounds. PULM: CTA B, no wheezes, crackles, rhonchi. No retractions. No resp. distress. No accessory muscle use. ABD: S, NT, ND, +BS. No rebound. No HSM.  Belly is benign EXTR: No c/c/e PSYCH: Normally interactive. Conversant.    Assessment and Plan: Diarrhea of infectious origin - Plan: Stool Culture, Clostridium Difficile by PCR([facility]), Norovirus group 1 & 2 by PCR, CBC, Comprehensive metabolic panel, Lipase, Norovirus group 1 & 2 by PCR, Clostridium Difficile by PCR([facility]), Stool Culture  Patient is here today with possible  recurrence of norovirus symptoms.  As above, he has had a prolonged struggle with recurrent diarrhea, he was seen in the ER for the first time 2 weeks ago.  Blood pressure is borderline low.  Advised him to hold blood pressure medications until diarrhea improves/resolves.  When he does start back likely can reduce HCTZ to half tablet continue to push fluids as tolerated  Lab work pending as above, will also repeat stool norovirus, C. difficile and stool culture  They wonder if his symptoms could be related to gallbladder disease or use of GLP-1 drug.  With known positive norovirus this is the  most likely culprit.  However, we will certainly keep other potential causes in mind.  If his stool cultures are negative this time I am certainly happy to do a gallbladder ultrasound.  We can also pause GLP-1 drug as desired.  At this time patient wishes to continue taking his GLP-1 if possible, he notes he had tolerated well for several weeks prior to onset of symptoms  Signed Abbe Amsterdam, MD  Received labs as below, message to patient  Results for orders placed or performed in visit on 06/27/23  CBC  Result Value Ref Range   WBC 9.4 4.0 - 10.5 K/uL   RBC 4.40 4.22 - 5.81 Mil/uL   Platelets 338.0 150.0 - 400.0 K/uL   Hemoglobin 13.9 13.0 - 17.0 g/dL   HCT 64.4 03.4 - 74.2 %   MCV 92.0 78.0 - 100.0 fl   MCHC 34.3 30.0 - 36.0 g/dL   RDW 59.5 63.8 - 75.6 %  Comprehensive metabolic panel  Result Value Ref Range   Sodium 137 135 - 145 mEq/L   Potassium 4.1 3.5 - 5.1 mEq/L   Chloride 100 96 - 112 mEq/L   CO2 31 19 - 32 mEq/L   Glucose, Bld 101 (H) 70 - 99 mg/dL   BUN 17 6 - 23 mg/dL   Creatinine, Ser 4.33 0.40 - 1.50 mg/dL   Total Bilirubin 0.8 0.2 - 1.2 mg/dL   Alkaline Phosphatase 71 39 - 117 U/L   AST 15 0 - 37 U/L   ALT 21 0 - 53 U/L   Total Protein 7.0 6.0 - 8.3 g/dL   Albumin 4.1 3.5 - 5.2 g/dL   GFR 29.51 (L) >88.41 mL/min   Calcium 9.1 8.4 - 10.5 mg/dL  Lipase  Result Value Ref  Range   Lipase 36.0 11.0 - 59.0 U/L

## 2023-06-27 NOTE — Patient Instructions (Addendum)
I am sorry you are feeling so bad- hold both BP meds for now until the diarrhea stops  Drop the hydrochlorothiazide to a 1/2 tablet when you do go back on

## 2023-06-28 ENCOUNTER — Encounter: Payer: Self-pay | Admitting: Family Medicine

## 2023-06-28 ENCOUNTER — Ambulatory Visit (HOSPITAL_BASED_OUTPATIENT_CLINIC_OR_DEPARTMENT_OTHER)
Admission: RE | Admit: 2023-06-28 | Discharge: 2023-06-28 | Disposition: A | Discharge status: Home / Self Care | Payer: BC Managed Care – PPO | Source: Ambulatory Visit | Attending: Family Medicine | Admitting: Family Medicine

## 2023-06-28 DIAGNOSIS — R112 Nausea with vomiting, unspecified: Secondary | ICD-10-CM | POA: Diagnosis not present

## 2023-06-28 DIAGNOSIS — K760 Fatty (change of) liver, not elsewhere classified: Secondary | ICD-10-CM | POA: Diagnosis not present

## 2023-06-28 LAB — CLOSTRIDIUM DIFFICILE BY PCR: Toxigenic C. Difficile by PCR: NEGATIVE

## 2023-06-29 ENCOUNTER — Telehealth: Payer: Self-pay | Admitting: Internal Medicine

## 2023-06-29 ENCOUNTER — Encounter: Payer: Self-pay | Admitting: Family Medicine

## 2023-06-29 ENCOUNTER — Other Ambulatory Visit: Payer: Self-pay

## 2023-06-29 DIAGNOSIS — R109 Unspecified abdominal pain: Secondary | ICD-10-CM

## 2023-06-29 DIAGNOSIS — R112 Nausea with vomiting, unspecified: Secondary | ICD-10-CM

## 2023-06-29 LAB — NOROVIRUS GROUP 1 & 2 BY PCR, STOOL
Norovirus 1 by PCR: NEGATIVE
Norovirus 2  by PCR: NEGATIVE

## 2023-06-29 NOTE — Telephone Encounter (Signed)
Spoke with pts wife and she is aware of recommendations per Dr. Rhea Belton. HIDA scan ordered, wife given rad scheduling phone number to call and schedule scan. She knows to follow diet recommendations per Dr. Rhea Belton and to hold the zepbound.

## 2023-06-29 NOTE — Telephone Encounter (Signed)
Despite how bad the patient feels his lab work does look good.  His recent C. difficile and norovirus panel is negative I understand that he continues to feel very poorly I have viewed the primary care outpatient appointment information and results by chart  I do think he should hold his GLP-1 medication due to the possible GI side effects until we get him feeling completely better; very low risk to hold this medicine I also think we should do an urgent or even stat outpatient HIDA scan to get a better gallbladder assessment; last ultrasound was limited by body habitus and bowel gas  Would focus on hydration and low fat and residue diet until we have an answer

## 2023-06-29 NOTE — Telephone Encounter (Signed)
Pts wife calling stating pt has so sick and is continuing to have issues. 19 days ago he had N&V, passed out at home and fell face down. EMS came, he got IV fluids in the ER. He has had 3 visits to the ER. At one visit he was told he had norovirus, he has since been retested and norovirus is negative. Pt had [place] to check gallbladder but the gallbladder was not visualized completely but what they saw looked ok. PCP told them what was seen looked good and she hoped he was feeling better. Wife states he is not feeling better and has lost weight. States he is able to eat white rice/brat diet and pedialyte. Reports the other day he was so hungry he ate some mashed potatoes, a little bit of salmon and some broccoli. She states he vomited a huge amount.   She is concerned that it might be his gallbladder because 4 other members of his family did not have typical presentation when the issue was the gallbladder. Scheduled pt to see Alcide Evener NP at the first available appt 07/09/23 at 11am. Wife states they will come to the appt if her husband is still alive.   Let her know the information will be sent to Dr. Rhea Belton to see if he has any other recommendations prior to the scheduled appt. Please advise.

## 2023-06-29 NOTE — Telephone Encounter (Signed)
Inbound call from patient's wife requesting a urgent call back regarding previous 10/20 mychart message. Please advise, thank you.

## 2023-07-01 LAB — STOOL CULTURE: E coli, Shiga toxin Assay: NEGATIVE

## 2023-07-02 ENCOUNTER — Encounter: Payer: Self-pay | Admitting: Family Medicine

## 2023-07-03 ENCOUNTER — Ambulatory Visit (HOSPITAL_COMMUNITY)
Admission: RE | Admit: 2023-07-03 | Discharge: 2023-07-03 | Disposition: A | Discharge status: Home / Self Care | Payer: BC Managed Care – PPO | Source: Ambulatory Visit | Attending: Internal Medicine | Admitting: Internal Medicine

## 2023-07-03 DIAGNOSIS — R112 Nausea with vomiting, unspecified: Secondary | ICD-10-CM | POA: Insufficient documentation

## 2023-07-03 DIAGNOSIS — R109 Unspecified abdominal pain: Secondary | ICD-10-CM | POA: Diagnosis not present

## 2023-07-03 MED ORDER — TECHNETIUM TC 99M MEBROFENIN IV KIT
5.1000 | PACK | Freq: Once | INTRAVENOUS | Status: AC | PRN
Start: 2023-07-03 — End: 2023-07-03
  Administered 2023-07-03: 5.1 via INTRAVENOUS

## 2023-07-04 ENCOUNTER — Ambulatory Visit: Payer: Self-pay | Admitting: Surgery

## 2023-07-04 DIAGNOSIS — K828 Other specified diseases of gallbladder: Secondary | ICD-10-CM | POA: Diagnosis not present

## 2023-07-05 NOTE — Progress Notes (Signed)
Preop call completed with Pt and his spouse.  He verbalizes understanding of 1230pm arrival to [facility] admitting, no solid food after MN and clear liquids allowed from MN until 1130.  Pt states he has stopped all meds in the past month due to weight loss and hypotension.

## 2023-07-06 ENCOUNTER — Other Ambulatory Visit: Payer: Self-pay

## 2023-07-06 ENCOUNTER — Encounter (HOSPITAL_COMMUNITY): Payer: Self-pay | Admitting: Surgery

## 2023-07-06 ENCOUNTER — Ambulatory Visit (HOSPITAL_COMMUNITY): Payer: BC Managed Care – PPO | Admitting: Anesthesiology

## 2023-07-06 ENCOUNTER — Ambulatory Visit (HOSPITAL_COMMUNITY)
Admission: RE | Admit: 2023-07-06 | Discharge: 2023-07-06 | Disposition: A | Discharge status: Home / Self Care | Payer: BC Managed Care – PPO | Source: Ambulatory Visit | Attending: Surgery | Admitting: Surgery

## 2023-07-06 ENCOUNTER — Ambulatory Visit (HOSPITAL_COMMUNITY): Payer: BC Managed Care – PPO

## 2023-07-06 ENCOUNTER — Telehealth: Payer: Self-pay | Admitting: Internal Medicine

## 2023-07-06 ENCOUNTER — Encounter (HOSPITAL_COMMUNITY)
Admission: RE | Disposition: A | Discharge status: Home / Self Care | Payer: Self-pay | Source: Ambulatory Visit | Attending: Surgery

## 2023-07-06 DIAGNOSIS — I10 Essential (primary) hypertension: Secondary | ICD-10-CM | POA: Insufficient documentation

## 2023-07-06 DIAGNOSIS — K828 Other specified diseases of gallbladder: Secondary | ICD-10-CM | POA: Diagnosis not present

## 2023-07-06 DIAGNOSIS — Z87891 Personal history of nicotine dependence: Secondary | ICD-10-CM | POA: Insufficient documentation

## 2023-07-06 DIAGNOSIS — Z0389 Encounter for observation for other suspected diseases and conditions ruled out: Secondary | ICD-10-CM | POA: Diagnosis not present

## 2023-07-06 DIAGNOSIS — I2510 Atherosclerotic heart disease of native coronary artery without angina pectoris: Secondary | ICD-10-CM | POA: Diagnosis not present

## 2023-07-06 DIAGNOSIS — Z8379 Family history of other diseases of the digestive system: Secondary | ICD-10-CM | POA: Insufficient documentation

## 2023-07-06 DIAGNOSIS — K811 Chronic cholecystitis: Secondary | ICD-10-CM | POA: Diagnosis not present

## 2023-07-06 HISTORY — PX: CHOLECYSTECTOMY: SHX55

## 2023-07-06 SURGERY — LAPAROSCOPIC CHOLECYSTECTOMY WITH INTRAOPERATIVE CHOLANGIOGRAM
Anesthesia: General | Laterality: Left

## 2023-07-06 MED ORDER — FENTANYL CITRATE (PF) 100 MCG/2ML IJ SOLN
INTRAMUSCULAR | Status: AC
  Filled 2023-07-06: qty 2

## 2023-07-06 MED ORDER — CHLORHEXIDINE GLUCONATE CLOTH 2 % EX PADS
6.0000 | MEDICATED_PAD | Freq: Once | CUTANEOUS | Status: AC
  Administered 2023-07-06: 6 via TOPICAL

## 2023-07-06 MED ORDER — CEFAZOLIN SODIUM-DEXTROSE 2-4 GM/100ML-% IV SOLN
2.0000 g | INTRAVENOUS | Status: AC
Start: 2023-07-06 — End: 2023-07-06
  Administered 2023-07-06: 2 g via INTRAVENOUS
  Filled 2023-07-06: qty 100

## 2023-07-06 MED ORDER — DEXAMETHASONE SODIUM PHOSPHATE 10 MG/ML IJ SOLN
INTRAMUSCULAR | Status: DC | PRN
  Administered 2023-07-06: 4 mg via INTRAVENOUS

## 2023-07-06 MED ORDER — AMISULPRIDE (ANTIEMETIC) 5 MG/2ML IV SOLN: 10.0000 mg | Freq: Once | INTRAVENOUS | Status: DC | PRN

## 2023-07-06 MED ORDER — MIDAZOLAM HCL 2 MG/2ML IJ SOLN
INTRAMUSCULAR | Status: AC
  Filled 2023-07-06: qty 2

## 2023-07-06 MED ORDER — ROCURONIUM BROMIDE 10 MG/ML (PF) SYRINGE
PREFILLED_SYRINGE | INTRAVENOUS | Status: AC
  Filled 2023-07-06: qty 10

## 2023-07-06 MED ORDER — BUPIVACAINE-EPINEPHRINE 0.5% -1:200000 IJ SOLN
INTRAMUSCULAR | Status: DC | PRN
  Administered 2023-07-06: 30 mL

## 2023-07-06 MED ORDER — TRAMADOL HCL 50 MG PO TABS: 50.0000 mg | ORAL_TABLET | Freq: Four times a day (QID) | ORAL | 0 refills | Status: DC | PRN

## 2023-07-06 MED ORDER — FENTANYL CITRATE PF 50 MCG/ML IJ SOSY
PREFILLED_SYRINGE | INTRAMUSCULAR | Status: AC
  Administered 2023-07-06: 50 ug via INTRAVENOUS
  Filled 2023-07-06: qty 2

## 2023-07-06 MED ORDER — LIDOCAINE 2% (20 MG/ML) 5 ML SYRINGE
INTRAMUSCULAR | Status: DC | PRN
  Administered 2023-07-06: 100 mg via INTRAVENOUS

## 2023-07-06 MED ORDER — MIDAZOLAM HCL 2 MG/2ML IJ SOLN
INTRAMUSCULAR | Status: DC | PRN
  Administered 2023-07-06: 2 mg via INTRAVENOUS

## 2023-07-06 MED ORDER — ONDANSETRON HCL 4 MG/2ML IJ SOLN
INTRAMUSCULAR | Status: AC
  Filled 2023-07-06: qty 2

## 2023-07-06 MED ORDER — LACTATED RINGERS IR SOLN
Status: DC | PRN
  Administered 2023-07-06: 1000 mL

## 2023-07-06 MED ORDER — ONDANSETRON HCL 4 MG/2ML IJ SOLN
INTRAMUSCULAR | Status: DC | PRN
  Administered 2023-07-06: 4 mg via INTRAVENOUS

## 2023-07-06 MED ORDER — PROPOFOL 10 MG/ML IV BOLUS
INTRAVENOUS | Status: AC
  Filled 2023-07-06: qty 20

## 2023-07-06 MED ORDER — ONDANSETRON HCL 4 MG/2ML IJ SOLN: 4.0000 mg | Freq: Once | INTRAMUSCULAR | Status: DC | PRN

## 2023-07-06 MED ORDER — ACETAMINOPHEN 500 MG PO TABS
1000.0000 mg | ORAL_TABLET | Freq: Once | ORAL | Status: AC
  Administered 2023-07-06: 1000 mg via ORAL
  Filled 2023-07-06: qty 2

## 2023-07-06 MED ORDER — LIDOCAINE HCL (PF) 2 % IJ SOLN
INTRAMUSCULAR | Status: AC
  Filled 2023-07-06: qty 5

## 2023-07-06 MED ORDER — CHLORHEXIDINE GLUCONATE CLOTH 2 % EX PADS: 6.0000 | MEDICATED_PAD | Freq: Once | CUTANEOUS | Status: DC

## 2023-07-06 MED ORDER — SODIUM CHLORIDE 0.9 % IV SOLN
INTRAVENOUS | Status: DC | PRN
  Administered 2023-07-06: 20 mL

## 2023-07-06 MED ORDER — CHLORHEXIDINE GLUCONATE 0.12 % MT SOLN
15.0000 mL | Freq: Once | OROMUCOSAL | Status: AC
  Administered 2023-07-06: 15 mL via OROMUCOSAL

## 2023-07-06 MED ORDER — OXYCODONE HCL 5 MG PO TABS
5.0000 mg | ORAL_TABLET | Freq: Once | ORAL | Status: AC
Start: 2023-07-06 — End: 2023-07-06
  Administered 2023-07-06: 5 mg via ORAL

## 2023-07-06 MED ORDER — FENTANYL CITRATE (PF) 100 MCG/2ML IJ SOLN
INTRAMUSCULAR | Status: DC | PRN
  Administered 2023-07-06: 50 ug via INTRAVENOUS
  Administered 2023-07-06: 100 ug via INTRAVENOUS
  Administered 2023-07-06: 50 ug via INTRAVENOUS

## 2023-07-06 MED ORDER — ROCURONIUM BROMIDE 10 MG/ML (PF) SYRINGE
PREFILLED_SYRINGE | INTRAVENOUS | Status: DC | PRN
  Administered 2023-07-06: 70 mg via INTRAVENOUS

## 2023-07-06 MED ORDER — FENTANYL CITRATE PF 50 MCG/ML IJ SOSY
PREFILLED_SYRINGE | INTRAMUSCULAR | Status: AC
  Filled 2023-07-06: qty 1

## 2023-07-06 MED ORDER — LACTATED RINGERS IV SOLN: INTRAVENOUS | Status: DC

## 2023-07-06 MED ORDER — OXYCODONE HCL 5 MG PO TABS
ORAL_TABLET | ORAL | Status: AC
  Filled 2023-07-06: qty 1

## 2023-07-06 MED ORDER — BUPIVACAINE-EPINEPHRINE (PF) 0.5% -1:200000 IJ SOLN
INTRAMUSCULAR | Status: AC
  Filled 2023-07-06: qty 30

## 2023-07-06 MED ORDER — 0.9 % SODIUM CHLORIDE (POUR BTL) OPTIME
TOPICAL | Status: DC | PRN
  Administered 2023-07-06: 1000 mL

## 2023-07-06 MED ORDER — SUGAMMADEX SODIUM 200 MG/2ML IV SOLN
INTRAVENOUS | Status: DC | PRN
  Administered 2023-07-06: 200 mg via INTRAVENOUS

## 2023-07-06 MED ORDER — FENTANYL CITRATE PF 50 MCG/ML IJ SOSY
25.0000 ug | PREFILLED_SYRINGE | INTRAMUSCULAR | Status: DC | PRN
  Administered 2023-07-06 (×2): 50 ug via INTRAVENOUS

## 2023-07-06 MED ORDER — PROPOFOL 10 MG/ML IV BOLUS
INTRAVENOUS | Status: DC | PRN
  Administered 2023-07-06: 200 mg via INTRAVENOUS

## 2023-07-06 SURGICAL SUPPLY — 38 items
ADH SKN CLS APL DERMABOND .7 (GAUZE/BANDAGES/DRESSINGS)
APL PRP STRL LF DISP 70% ISPRP (MISCELLANEOUS) ×2
APPLIER CLIP ROT 10 11.4 M/L (STAPLE) ×1
APR CLP MED LRG 11.4X10 (STAPLE) ×1
BAG COUNTER SPONGE SURGICOUNT (BAG) IMPLANT
BAG SPNG CNTER NS LX DISP (BAG)
CABLE HIGH FREQUENCY MONO STRZ (ELECTRODE) ×2 IMPLANT
CHLORAPREP W/TINT 26 (MISCELLANEOUS) ×4 IMPLANT
CLIP APPLIE ROT 10 11.4 M/L (STAPLE) ×2 IMPLANT
COVER MAYO STAND XLG (MISCELLANEOUS) ×2 IMPLANT
COVER SURGICAL LIGHT HANDLE (MISCELLANEOUS) ×2 IMPLANT
DERMABOND ADVANCED .7 DNX12 (GAUZE/BANDAGES/DRESSINGS) IMPLANT
DRAPE C-ARM 42X120 X-RAY (DRAPES) ×2 IMPLANT
ELECT REM PT RETURN 15FT ADLT (MISCELLANEOUS) ×2 IMPLANT
GAUZE SPONGE 2X2 8PLY STRL LF (GAUZE/BANDAGES/DRESSINGS) ×2 IMPLANT
GLOVE SURG ORTHO 8.0 STRL STRW (GLOVE) ×2 IMPLANT
GLOVE SURG SYN 7.5 E (GLOVE) ×2 IMPLANT
GLOVE SURG SYN 7.5 PF PI (GLOVE) ×4 IMPLANT
GOWN STRL REUS W/ TWL XL LVL3 (GOWN DISPOSABLE) ×2 IMPLANT
GOWN STRL REUS W/TWL XL LVL3 (GOWN DISPOSABLE) ×1
IRRIG SUCT STRYKERFLOW 2 WTIP (MISCELLANEOUS) ×1
IRRIGATION SUCT STRKRFLW 2 WTP (MISCELLANEOUS) ×2 IMPLANT
KIT BASIN OR (CUSTOM PROCEDURE TRAY) ×2 IMPLANT
KIT TURNOVER KIT A (KITS) IMPLANT
SCISSORS LAP 5X35 DISP (ENDOMECHANICALS) ×2 IMPLANT
SET CHOLANGIOGRAPH MIX (MISCELLANEOUS) ×2 IMPLANT
SET TUBE SMOKE EVAC HIGH FLOW (TUBING) ×2 IMPLANT
SLEEVE Z-THREAD 5X100MM (TROCAR) ×2 IMPLANT
SPIKE FLUID TRANSFER (MISCELLANEOUS) ×2 IMPLANT
SUT MNCRL AB 4-0 PS2 18 (SUTURE) ×2 IMPLANT
SYS BAG RETRIEVAL 10MM (BASKET) ×1
SYSTEM BAG RETRIEVAL 10MM (BASKET) ×2 IMPLANT
TOWEL OR 17X26 10 PK STRL BLUE (TOWEL DISPOSABLE) ×2 IMPLANT
TOWEL OR NON WOVEN STRL DISP B (DISPOSABLE) ×2 IMPLANT
TRAY LAPAROSCOPIC (CUSTOM PROCEDURE TRAY) ×2 IMPLANT
TROCAR 11X100 Z THREAD (TROCAR) ×2 IMPLANT
TROCAR BALLN 12MMX100 BLUNT (TROCAR) ×2 IMPLANT
TROCAR Z-THREAD OPTICAL 5X100M (TROCAR) ×2 IMPLANT

## 2023-07-06 NOTE — Anesthesia Postprocedure Evaluation (Signed)
Anesthesia Post Note  Patient: Henry Wallace  Procedure(s) Performed: LAPAROSCOPIC CHOLECYSTECTOMY WITH INTRAOPERATIVE CHOLANGIOGRAM (Left)     Patient location during evaluation: PACU Anesthesia Type: General Level of consciousness: awake and alert Pain management: pain level controlled Vital Signs Assessment: post-procedure vital signs reviewed and stable Respiratory status: spontaneous breathing, nonlabored ventilation, respiratory function stable and patient connected to nasal cannula oxygen Cardiovascular status: blood pressure returned to baseline and stable Postop Assessment: no apparent nausea or vomiting Anesthetic complications: no   No notable events documented.  Last Vitals:  Vitals:   07/06/23 1712 07/06/23 1715  BP:  (!) 147/76  Pulse: (!) 55   Resp: 13 12  Temp:    SpO2: 100%     Last Pain:  Vitals:   07/06/23 1715  TempSrc:   PainSc: 6                  Collene Schlichter

## 2023-07-06 NOTE — Discharge Instructions (Addendum)
[facility]  LAPAROSCOPIC SURGERY:  POST-OP INSTRUCTIONS  Always review your discharge instruction sheet given to you by the facility where your surgery was performed.  A prescription for pain medication may be given to you upon discharge.  Take your pain medication as prescribed.  If narcotic pain medicine is not needed, then you may take acetaminophen (Tylenol) or ibuprofen (Advil) as needed.  Take your usually prescribed medications unless otherwise directed.  If you need a refill on your pain medication, please contact your pharmacy.  They will contact our office to request authorization. Prescriptions will not be filled after 5 P.M. or on weekends.  You should follow a light diet the first few days after arrival home, such as soup and crackers or toast.  Be sure to include plenty of fluids daily.  Most patients will experience some swelling and bruising in the area of the incisions.  Ice packs will help.  Swelling and bruising can take several days to resolve.   It is common to experience some constipation after surgery.  Increasing fluid intake and taking a stool softener (such as Colace) will usually help or prevent this problem from occurring.  A mild laxative (Milk of Magnesia or Miralax) should be taken according to package instructions if there has been no bowel movement after 48 hours.  You will likely have Dermabond (topical glue) over your incisions.  This seals the incisions and allows you to bathe and shower at any time after your surgery.  Glue should remain in place for up to 10 days.  It may be removed after 10 days by pealing off the Dermabond material or using Vaseline or naval jelly to remove.  If you have steri-strips over your incisions, you may remove the gauze bandage on the second day after surgery, and you may shower at that time.  Leave your steri-strips (small skin tapes) in place directly over the incision.  These strips should remain on the  skin for 5-7 days and then be removed.  You may get them wet in the shower and pat them dry.  Any sutures or staples will be removed at the office during your follow-up visit.  ACTIVITIES:  You may resume regular (light) daily activities beginning the next day - such as daily self-care, walking, climbing stairs - gradually increasing activities as tolerated.  You may have sexual intercourse when it is comfortable.  Refrain from any heavy lifting or straining until approved by your doctor.  You may drive when you are no longer taking prescription pain medication, when you can comfortably wear a seatbelt, and when you can safely maneuver your car and apply brakes.  You should see your doctor in the office for a follow-up appointment approximately 2-3 weeks after your surgery.  Make sure that you call for this appointment within a day or two after you arrive home to insure a convenient appointment time.  WHEN TO CALL YOUR DOCTOR: Fever over 101.0 Inability to urinate Continued bleeding from incision Increased pain, redness, or drainage from the incision Increasing abdominal pain  The clinic staff is available to answer your questions during regular business hours.  Please don't hesitate to call and ask to speak to one of the nurses for clinical concerns.  If you have a medical emergency, go to the nearest emergency room or call 911.  A surgeon from [facility] is always on call for the hospital.  Shakirah Kirkey, MD [facility] Office: [phone] Toll Free:    [phone] FAX [phone]  Website: [email or web address] 

## 2023-07-06 NOTE — Telephone Encounter (Signed)
Pt having gallbladder surgery today. Pt was previously told to keep his OV as scheduled with PA on 11/11. Wife wants to know if they need to keep that appt, states she knows he will be having a follow-up appt with surgery. Please advise.

## 2023-07-06 NOTE — H&P (Signed)
REFERRING PHYSICIAN: Pyrtle, Burnard Leigh, MD  PROVIDER: Drew Lips Myra Rude, MD   Chief Complaint: New Consultation (Biliary dyskinesia)  History of Present Illness:  Patient is referred by Dr. Erick Blinks and Dr. Abbe Amsterdam for surgical evaluation and management of newly diagnosed biliary dyskinesia. Patient has had an illness lasting a few months. He began having significant weight loss. He was taking a GLP-1 agent. That has been discontinued. Patient has experienced nausea and continued weight loss and profuse diarrhea. He has changed his diet. He has been gluten-free and avoiding red meat for several years. He is now able to tolerate only bananas and applesauce. He has had episodes of dehydration requiring evaluation in the emergency department. He has undergone CT scan of the abdomen as well as an abdominal ultrasound. Yesterday the patient underwent a nuclear medicine hepatobiliary scan which demonstrated significant biliary dyskinesia with an ejection fraction of 9%. Patient is now referred for consideration for cholecystectomy for management of biliary dyskinesia.  Patient has undergone a previous umbilical hernia repair many years ago. He has had no other abdominal surgery. He has no other history of hepatobiliary disease or pancreatic disease. There is a family history of gallbladder disease in multiple aunts. Patient presents today accompanied by his wife.  Review of Systems: A complete review of systems was obtained from the patient. I have reviewed this information and discussed as appropriate with the patient. See HPI as well for other ROS.  Review of Systems  Constitutional: Positive for malaise/fatigue and weight loss.  HENT: Negative.  Eyes: Negative.  Respiratory: Negative.  Cardiovascular: Negative.  Gastrointestinal: Positive for diarrhea and nausea.  Genitourinary: Negative.  Musculoskeletal: Negative.  Skin: Negative.  Neurological: Negative.   Endo/Heme/Allergies: Negative.  Psychiatric/Behavioral: Negative.    Medical History: History reviewed. No pertinent past medical history.  Patient Active Problem List  Diagnosis  Biliary dyskinesia   History reviewed. No pertinent surgical history.   No Known Allergies  Current Outpatient Medications on File Prior to Visit  Medication Sig Dispense Refill  amLODIPine (NORVASC) 5 MG tablet Take 5 mg by mouth once daily  citalopram (CELEXA) 40 MG tablet Take 40 mg by mouth once daily  hydroCHLOROthiazide (HYDRODIURIL) 25 MG tablet Take 1 tablet by mouth once daily  rosuvastatin (CRESTOR) 5 MG tablet  ZEPBOUND 10 mg/0.5 mL pen injector Inject subcutaneously  5-hydroxytryptophan, 5-HTP, 100 mg Cap Take by mouth  aspirin 81 MG chewable tablet Take by mouth once daily   No current facility-administered medications on file prior to visit.   History reviewed. No pertinent family history.   Social History   Tobacco Use  Smoking Status Not on file  Smokeless Tobacco Not on file    Social History   Socioeconomic History  Marital status: Married   Objective:   Vitals:  BP: 121/85  Pulse: 65  Temp: 36.8 C (98.2 F)  SpO2: 99%  Weight: 90.7 kg (200 lb)  Height: 177.8 cm (5\' 10" )  PainSc: 0-No pain   Body mass index is 28.7 kg/m.  Physical Exam   GENERAL APPEARANCE Comfortable, no acute issues Development: normal Gross deformities: none  SKIN Rash, lesions, ulcers: none Induration, erythema: none Nodules: none palpable  EYES Conjunctiva and lids: normal Pupils: equal  EARS, NOSE, MOUTH, THROAT External ears: no lesion or deformity External nose: no lesion or deformity Hearing: grossly normal  NECK Symmetric: yes Trachea: midline  CHEST/CV Not assessed  ABDOMEN Soft without distention. No palpable masses. No significant tenderness. No  sign of recurrent umbilical hernia.  GENITOURINARY/RECTAL Not assessed  MUSCULOSKELETAL Station and gait:  normal Digits and nails: no clubbing or cyanosis Muscle strength: grossly normal all extremities Deformity: none  LYMPHATIC Cervical: none palpable Supraclavicular: none palpable  PSYCHIATRIC Oriented to person, place, and time: yes Mood and affect: normal for situation Judgment and insight: appropriate for situation   Assessment and Plan:   Biliary dyskinesia   Patient is referred by his gastroenterologist and his primary care physician for surgical evaluation and management of newly diagnosed biliary dyskinesia.  Patient is provided with written literature on gallbladder surgery to review at home.  Today we reviewed his clinical history. We reviewed his imaging studies including his hepatobiliary scan which demonstrates significant biliary dyskinesia with an ejection fraction of only 9% upon stimulation. We discussed proceeding with laparoscopic cholecystectomy. We discussed the size and location of the surgical incisions. We discussed performing intraoperative cholangiography to evaluate the biliary tract. We discussed potential complications including the need for conversion to open surgery. We discussed his postoperative recovery and return to normal activities. The patient and his wife understand and wish to proceed with surgery in the near future.   Darnell Level, MD [facility] practice Office: [phone]

## 2023-07-06 NOTE — Telephone Encounter (Signed)
PT wife is calling to advise [place] that PT is about to have gallbladder surgery today for the symptoms he was to see [place] for on 11/11. She wants to know is the appointment still necessary. Please advise.

## 2023-07-06 NOTE — Op Note (Signed)
Procedure Note  Pre-operative Diagnosis:  Biliary dyskinesia  Post-operative Diagnosis:  chronic cholecystitis, biliary dyskinesia  Surgeon:  Darnell Level, MD  Assistant:  none   Procedure:  Laparoscopic cholecystectomy with intra-operative cholangiography  Anesthesia:  General  Estimated Blood Loss:  minimal  Drains: none         Specimen: gallbladder to pathology  Indications:  Patient is referred by Dr. Erick Blinks and Dr. Shanda Bumps Copland for surgical evaluation and management of newly diagnosed biliary dyskinesia. Patient has had an illness lasting a few months. He began having significant weight loss. He was taking a GLP-1 agent. That has been discontinued. Patient has experienced nausea and continued weight loss and profuse diarrhea. He has changed his diet. He has been gluten-free and avoiding red meat for several years. He is now able to tolerate only bananas and applesauce. He has had episodes of dehydration requiring evaluation in the emergency department. He has undergone CT scan of the abdomen as well as an abdominal ultrasound. Yesterday the patient underwent a nuclear medicine hepatobiliary scan which demonstrated significant biliary dyskinesia with an ejection fraction of 9%. Patient is now referred for consideration for cholecystectomy for management of biliary dyskinesia.   Procedure description: The patient was seen in the pre-op holding area. The risks, benefits, complications, treatment options, and expected outcomes were previously discussed with the patient. The patient agreed with the proposed plan and has signed the informed consent form.  The patient was transported to operating room #1 at the [facility]. The patient was placed in the supine position on the operating room table. Following induction of general anesthesia, the abdomen was prepped and draped in the usual aseptic fashion.  An incision was made in the skin above the umbilicus. The midline fascia  was incised and the peritoneal cavity was entered and a Hasson cannula was introduced under direct vision. The cannula was secured with a 0-Vicryl pursestring suture. Pneumoperitoneum was established with carbon dioxide. Additional cannulae were introduced under direct vision along the right costal margin in the midline, mid-clavicular line, and anterior axillary line.   The gallbladder was identified and the fundus grasped and retracted cephalad. Extensive adhesions were taken down bluntly and the electrocautery was utilized as needed, taking care not to involve any adjacent structures. The infundibulum was grasped and retracted laterally, exposing the peritoneum overlying the triangle of Calot. The peritoneum was incised and structures exposed with blunt dissection. The cystic duct was clearly identified, bluntly dissected circumferentially, and clipped at the neck of the gallbladder.  An incision was made in the cystic duct and the cholangiogram catheter introduced. The catheter was secured using an ligaclip.  Real-time cholangiography was performed using C-arm fluoroscopy.  There was rapid filling of a normal caliber common bile duct.  There was reflux of contrast into the left and right hepatic ductal systems.  There was free flow distally into the duodenum without filling defect or obstruction.  The catheter was removed from the peritoneal cavity.  The cystic duct was then ligated with ligaclips and divided. The cystic artery was identified, dissected circumferentially, ligated with ligaclips, and divided.  The gallbladder was dissected away from the gallbladder bed using the electrocautery for hemostasis. The gallbladder was completely removed from the liver and placed into an endocatch bag. The gallbladder was removed in the endocatch bag through the umbilical port site and submitted to pathology for review.  The right upper quadrant was irrigated and the gallbladder bed was inspected. Hemostasis  was achieved  with the electrocautery.  Cannulae were removed under direct vision and good hemostasis was noted. Pneumoperitoneum was released and the majority of the carbon dioxide evacuated. The umbilical wound was irrigated and the fascia was then closed with the pursestring suture.  Local anesthetic was infiltrated at all port sites. Skin incisions were closed with 4-0 Monocril subcuticular sutures and Dermabond was applied.  Instrument, sponge, and needle counts were correct at the conclusion of the case.  The patient was awakened from anesthesia and brought to the recovery room in stable condition.  The patient tolerated the procedure well.   Darnell Level, MD [facility] Office: [phone]

## 2023-07-06 NOTE — Telephone Encounter (Signed)
Patient aware and appt cancelled

## 2023-07-06 NOTE — Anesthesia Preprocedure Evaluation (Addendum)
Anesthesia Evaluation  Patient identified by MRN, date of birth, ID band Patient awake    Reviewed: Allergy & Precautions, NPO status , Patient's Chart, lab work & pertinent test results  History of Anesthesia Complications Negative for: history of anesthetic complications  Airway Mallampati: III  TM Distance: >3 FB Neck ROM: Full    Dental  (+) Dental Advisory Given   Pulmonary neg shortness of breath, neg sleep apnea, neg COPD, neg recent URI, former smoker   Pulmonary exam normal breath sounds clear to auscultation       Cardiovascular hypertension (amlodipine, HCTZ (stopped taking all meds 3.5 weeks ago)), Pt. on medications (-) angina + CAD  (-) Past MI, (-) Cardiac Stents and (-) CABG + dysrhythmias (incomplete RBBB)  Rhythm:Regular Rate:Normal  HLD, carotid stenosis   Neuro/Psych  Headaches, neg Seizures PSYCHIATRIC DISORDERS  Depression       GI/Hepatic ,GERD  Medicated,,Hepatic steatosis diverticulosis   Endo/Other  negative endocrine ROS    Renal/GU negative Renal ROS     Musculoskeletal   Abdominal   Peds  Hematology negative hematology ROS (+) Lab Results      Component                Value               Date                      WBC                      9.4                 06/27/2023                HGB                      13.9                06/27/2023                HCT                      40.5                06/27/2023                MCV                      92.0                06/27/2023                PLT                      338.0               06/27/2023              Anesthesia Other Findings Recent Norovirus infection  Last Zepbound: 2 weeks ago  Reproductive/Obstetrics                             Anesthesia Physical Anesthesia Plan  ASA: 2  Anesthesia Plan: General   Post-op Pain Management: Tylenol PO (pre-op)*   Induction: Intravenous  PONV Risk Score  and Plan: 2 and Ondansetron, Dexamethasone and Treatment may vary due to  age or medical condition  Airway Management Planned: Oral ETT  Additional Equipment:   Intra-op Plan:   Post-operative Plan: Extubation in OR  Informed Consent: I have reviewed the patients History and Physical, chart, labs and discussed the procedure including the risks, benefits and alternatives for the proposed anesthesia with the patient or authorized representative who has indicated his/her understanding and acceptance.     Dental advisory given  Plan Discussed with: CRNA and Anesthesiologist  Anesthesia Plan Comments: (Risks of general anesthesia discussed including, but not limited to, sore throat, hoarse voice, chipped/damaged teeth, injury to vocal cords, nausea and vomiting, allergic reactions, lung infection, heart attack, stroke, and death. All questions answered. )        Anesthesia Quick Evaluation

## 2023-07-06 NOTE — Transfer of Care (Signed)
Immediate Anesthesia Transfer of Care Note  Patient: Henry Wallace  Procedure(s) Performed: LAPAROSCOPIC CHOLECYSTECTOMY WITH INTRAOPERATIVE CHOLANGIOGRAM (Left)  Patient Location: PACU  Anesthesia Type:General  Level of Consciousness: drowsy  Airway & Oxygen Therapy: Patient Spontanous Breathing and Patient connected to face mask oxygen  Post-op Assessment: Report given to RN, Post -op Vital signs reviewed and stable, and Patient moving all extremities X 4  Post vital signs: Reviewed and stable  Last Vitals:  Vitals Value Taken Time  BP 168/88 07/06/23 1636  Temp    Pulse 57 07/06/23 1639  Resp 18 07/06/23 1639  SpO2 100 % 07/06/23 1639  Vitals shown include unfiled device data.  Last Pain:  Vitals:   07/06/23 1251  TempSrc: Oral         Complications: No notable events documented.

## 2023-07-06 NOTE — Telephone Encounter (Signed)
Does not need to keep this appt

## 2023-07-06 NOTE — Interval H&P Note (Signed)
History and Physical Interval Note:  07/06/2023 2:37 PM  Henry Wallace  has presented today for surgery, with the diagnosis of BILIARY DYSKINESIA.  The various methods of treatment have been discussed with the patient and family. After consideration of risks, benefits and other options for treatment, the patient has consented to    Procedure(s): LAPAROSCOPIC CHOLECYSTECTOMY WITH INTRAOPERATIVE CHOLANGIOGRAM (Left) as a surgical intervention.    The patient's history has been reviewed, patient examined, no change in status, stable for surgery.  I have reviewed the patient's chart and labs.  Questions were answered to the patient's satisfaction.    Darnell Level, MD [facility] practice Office: [phone]   Darnell Level

## 2023-07-06 NOTE — Anesthesia Procedure Notes (Signed)
Procedure Name: Intubation Date/Time: 07/06/2023 2:58 PM  Performed by: Nelle Don, CRNAPre-anesthesia Checklist: Emergency Drugs available, Patient identified, Suction available and Patient being monitored Patient Re-evaluated:Patient Re-evaluated prior to induction Oxygen Delivery Method: Circle system utilized Preoxygenation: Pre-oxygenation with 100% oxygen Induction Type: IV induction Ventilation: Mask ventilation without difficulty Laryngoscope Size: Mac and 4 Grade View: Grade II Tube type: Oral Tube size: 7.5 mm Number of attempts: 1 Airway Equipment and Method: Stylet Placement Confirmation: ETT inserted through vocal cords under direct vision, positive ETCO2 and breath sounds checked- equal and bilateral Secured at: 23 cm Tube secured with: Tape Dental Injury: Teeth and Oropharynx as per pre-operative assessment

## 2023-07-07 ENCOUNTER — Encounter (HOSPITAL_COMMUNITY): Payer: Self-pay | Admitting: Surgery

## 2023-07-09 ENCOUNTER — Ambulatory Visit: Payer: BC Managed Care – PPO | Admitting: Nurse Practitioner

## 2023-07-10 LAB — SURGICAL PATHOLOGY

## 2023-07-11 ENCOUNTER — Encounter: Payer: Self-pay | Admitting: Family Medicine

## 2023-07-11 DIAGNOSIS — J209 Acute bronchitis, unspecified: Secondary | ICD-10-CM

## 2023-07-16 ENCOUNTER — Ambulatory Visit
Admission: RE | Admit: 2023-07-16 | Discharge: 2023-07-16 | Disposition: A | Discharge status: Home / Self Care | Payer: BC Managed Care – PPO | Source: Ambulatory Visit | Attending: Family Medicine | Admitting: Family Medicine

## 2023-07-16 ENCOUNTER — Encounter: Payer: Self-pay | Admitting: Family Medicine

## 2023-07-16 DIAGNOSIS — J209 Acute bronchitis, unspecified: Secondary | ICD-10-CM

## 2023-07-16 DIAGNOSIS — I10 Essential (primary) hypertension: Secondary | ICD-10-CM

## 2023-07-16 DIAGNOSIS — F325 Major depressive disorder, single episode, in full remission: Secondary | ICD-10-CM

## 2023-07-16 DIAGNOSIS — R0602 Shortness of breath: Secondary | ICD-10-CM | POA: Diagnosis not present

## 2023-07-16 DIAGNOSIS — E785 Hyperlipidemia, unspecified: Secondary | ICD-10-CM

## 2023-07-16 DIAGNOSIS — I6523 Occlusion and stenosis of bilateral carotid arteries: Secondary | ICD-10-CM

## 2023-07-16 DIAGNOSIS — R059 Cough, unspecified: Secondary | ICD-10-CM | POA: Diagnosis not present

## 2023-07-16 MED ORDER — DOXYCYCLINE HYCLATE 100 MG PO CAPS: 100.0000 mg | ORAL_CAPSULE | Freq: Two times a day (BID) | ORAL | 0 refills | Status: DC

## 2023-07-16 NOTE — Addendum Note (Signed)
Addended by: Abbe Amsterdam C on: 07/16/2023 08:44 AM   Modules accepted: Orders

## 2023-07-16 NOTE — Addendum Note (Signed)
Addended by: Abbe Amsterdam C on: 07/16/2023 10:07 AM   Modules accepted: Orders

## 2023-07-19 ENCOUNTER — Ambulatory Visit (HOSPITAL_BASED_OUTPATIENT_CLINIC_OR_DEPARTMENT_OTHER): Payer: BC Managed Care – PPO

## 2023-07-19 ENCOUNTER — Other Ambulatory Visit: Payer: BC Managed Care – PPO | Admitting: Urology

## 2023-07-21 ENCOUNTER — Other Ambulatory Visit: Payer: Self-pay | Admitting: Family Medicine

## 2023-07-21 DIAGNOSIS — E785 Hyperlipidemia, unspecified: Secondary | ICD-10-CM

## 2023-07-21 DIAGNOSIS — I10 Essential (primary) hypertension: Secondary | ICD-10-CM

## 2023-07-21 DIAGNOSIS — I6523 Occlusion and stenosis of bilateral carotid arteries: Secondary | ICD-10-CM

## 2023-07-21 MED ORDER — CITALOPRAM HYDROBROMIDE 40 MG PO TABS: 40.0000 mg | ORAL_TABLET | Freq: Every day | ORAL | 3 refills | Status: DC

## 2023-07-21 MED ORDER — ROSUVASTATIN CALCIUM 10 MG PO TABS: ORAL_TABLET | ORAL | 3 refills | Status: DC

## 2023-07-22 MED ORDER — TIRZEPATIDE-WEIGHT MANAGEMENT 2.5 MG/0.5ML ~~LOC~~ SOLN: 2.5000 mg | SUBCUTANEOUS | 1 refills | Status: DC

## 2023-07-22 NOTE — Addendum Note (Signed)
Addended by: Abbe Amsterdam C on: 07/22/2023 08:04 PM   Modules accepted: Orders

## 2023-07-25 ENCOUNTER — Other Ambulatory Visit: Payer: Self-pay

## 2023-07-25 DIAGNOSIS — I6523 Occlusion and stenosis of bilateral carotid arteries: Secondary | ICD-10-CM

## 2023-07-25 DIAGNOSIS — E785 Hyperlipidemia, unspecified: Secondary | ICD-10-CM

## 2023-07-25 DIAGNOSIS — I10 Essential (primary) hypertension: Secondary | ICD-10-CM

## 2023-07-25 MED ORDER — ROSUVASTATIN CALCIUM 10 MG PO TABS: ORAL_TABLET | ORAL | 3 refills | Status: DC

## 2023-07-31 ENCOUNTER — Ambulatory Visit: Payer: BC Managed Care – PPO | Admitting: Urology

## 2023-08-02 DIAGNOSIS — L308 Other specified dermatitis: Secondary | ICD-10-CM | POA: Diagnosis not present

## 2023-08-02 DIAGNOSIS — L738 Other specified follicular disorders: Secondary | ICD-10-CM | POA: Diagnosis not present

## 2023-08-06 ENCOUNTER — Other Ambulatory Visit (HOSPITAL_BASED_OUTPATIENT_CLINIC_OR_DEPARTMENT_OTHER): Payer: Self-pay | Admitting: Urology

## 2023-08-06 ENCOUNTER — Telehealth: Payer: Self-pay

## 2023-08-06 DIAGNOSIS — R972 Elevated prostate specific antigen [PSA]: Secondary | ICD-10-CM

## 2023-08-06 NOTE — Telephone Encounter (Signed)
It has been 2 months since pt was here and he did remember his prep instructions. Discussed instructions with pt and wife and verified he has not been on any blood thinners. Answered all questions and pt verbalized understanding.

## 2023-08-07 ENCOUNTER — Ambulatory Visit (INDEPENDENT_AMBULATORY_CARE_PROVIDER_SITE_OTHER): Payer: BC Managed Care – PPO | Admitting: Urology

## 2023-08-07 ENCOUNTER — Ambulatory Visit (HOSPITAL_BASED_OUTPATIENT_CLINIC_OR_DEPARTMENT_OTHER)
Admission: RE | Admit: 2023-08-07 | Discharge: 2023-08-07 | Disposition: A | Discharge status: Home / Self Care | Payer: BC Managed Care – PPO | Source: Ambulatory Visit | Attending: Urology | Admitting: Urology

## 2023-08-07 VITALS — BP 153/97 | HR 65 | Ht 70.5 in | Wt 205.0 lb

## 2023-08-07 DIAGNOSIS — N4232 Atypical small acinar proliferation of prostate: Secondary | ICD-10-CM | POA: Diagnosis not present

## 2023-08-07 DIAGNOSIS — R972 Elevated prostate specific antigen [PSA]: Secondary | ICD-10-CM

## 2023-08-07 DIAGNOSIS — N4231 Prostatic intraepithelial neoplasia: Secondary | ICD-10-CM | POA: Diagnosis not present

## 2023-08-07 MED ORDER — CEFTRIAXONE SODIUM 1 G IJ SOLR
1.0000 g | Freq: Once | INTRAMUSCULAR | Status: AC
  Administered 2023-08-07: 1 g via INTRAMUSCULAR

## 2023-08-07 NOTE — Progress Notes (Signed)
   Assessment: 1. Elevated PSA     Plan: Fu 1 week to review path and further options  Chief Complaint: Elevated psa  HPI: Henry Wallace is a 62 y.o. male who presents for continued evaluation of elevated psa (5.2) Patient is here today for planned transrectal ultrasound prostate biopsy.  He has completed standard prep and also received 1 g of Rocephin IM prior to the procedure today.  Portions of the above documentation were copied from a prior visit for review purposes only.  Allergies: No Known Allergies  PMH: Past Medical History:  Diagnosis Date   Adenomatous colon polyp    Coronary artery disease    Diverticulosis    Hepatic steatosis    Hypertension    Migraines     PSH: Past Surgical History:  Procedure Laterality Date   ADENOIDECTOMY     ANKLE SURGERY Left 1994   CHOLECYSTECTOMY Left 07/06/2023   Procedure: LAPAROSCOPIC CHOLECYSTECTOMY WITH INTRAOPERATIVE CHOLANGIOGRAM;  Surgeon: Darnell Level, MD;  Location: [facility];  Service: General;  Laterality: Left;   HERNIA REPAIR     PROSTATE SURGERY     VASECTOMY      SH: Social History   Tobacco Use   Smoking status: Former    Types: Cigarettes   Smokeless tobacco: Never  Substance Use Topics   Alcohol use: Not Currently    Comment: occ   Drug use: No    ROS: Constitutional:  Negative for fever, chills, weight loss CV: Negative for chest pain, previous MI, hypertension Respiratory:  Negative for shortness of breath, wheezing, sleep apnea, frequent cough GI:  Negative for nausea, vomiting, bloody stool, GERD  PE: There were no vitals filed for this visit.  GENERAL APPEARANCE:  Well appearing, well developed, well nourished, NAD    Results: No results found for this or any previous visit (from the past 24 hour(s)).    TRANSRECTAL ULTRASOUND AND PROSTATE BIOPSY  Indication:  Elevated PSA  Prophylactic antibiotic administration: Rocephin (patient also received levaquin)  All medications that  could result in increased bleeding were discontinued within an appropriate period of the time of biopsy.  Risk including bleeding and infection were discussed.  Informed consent was obtained.  Pre-procedural time out was performed.  The patient was placed in the left lateral decubitus position. DRE:  nst, prostate approx 40gm without n/i  PROCEDURE 1.  TRANSRECTAL ULTRASOUND OF THE PROSTATE  The 7 MHz transrectal probe was used to image the prostate.  Anal stenosis was not noted.  TRUS volume: 44.7 ml  Hypoechoic areas: None/// TZ bph nodules noted bilaterally  Hyperechoic areas: None  Central calcifications: not present  Margins:  normal  Seminal Vesicles: normal   PROCEDURE 2:  PROSTATE BIOPSY  A periprostatic block was performed using 1% lidocaine and transrectal ultrasound guidance. Under transrectal ultrasound guidance, and using the Biopty gun, prostate biopsies were obtained systematically from the apex, mid gland, and base bilaterally.  A total of 12 cores were obtained.  Hemostasis was obtained with gentle pressure on the prostate.  The procedures were well-tolerated.  No significant bleeding was noted at the end of the procedure.  The patient was stable for discharge from the office.

## 2023-08-07 NOTE — Progress Notes (Signed)
IM Injection  Patient is present today for an IM Injection for treatment of infection prevention post prostate biopsy Drug: Ceftriaxone Dose:1g Location:left glute Lot: [id number] Exp:02/2025 Patient tolerated well, no complications were noted  Performed by: Quinlin Conant N., CMA(AAMA)

## 2023-08-08 LAB — URINALYSIS, ROUTINE W REFLEX MICROSCOPIC
Bilirubin, UA: NEGATIVE
Glucose, UA: NEGATIVE
Ketones, UA: NEGATIVE
Leukocytes,UA: NEGATIVE
Nitrite, UA: NEGATIVE
Protein,UA: NEGATIVE
RBC, UA: NEGATIVE
Specific Gravity, UA: 1.025 (ref 1.005–1.030)
Urobilinogen, Ur: 0.2 mg/dL (ref 0.2–1.0)
pH, UA: 5.5 (ref 5.0–7.5)

## 2023-08-09 ENCOUNTER — Telehealth: Payer: Self-pay | Admitting: Urology

## 2023-08-09 NOTE — Telephone Encounter (Signed)
Bill with the lab needs collection date. Prostate Biopsy [phone]

## 2023-08-10 NOTE — Telephone Encounter (Signed)
Spoke with Annette Stable, gave collection date.

## 2023-08-13 ENCOUNTER — Encounter: Payer: Self-pay | Admitting: Urology

## 2023-08-14 ENCOUNTER — Ambulatory Visit (INDEPENDENT_AMBULATORY_CARE_PROVIDER_SITE_OTHER): Payer: BC Managed Care – PPO | Admitting: Urology

## 2023-08-14 ENCOUNTER — Encounter: Payer: Self-pay | Admitting: Urology

## 2023-08-14 DIAGNOSIS — R972 Elevated prostate specific antigen [PSA]: Secondary | ICD-10-CM | POA: Diagnosis not present

## 2023-08-14 LAB — MICROSCOPIC EXAMINATION: RBC, Urine: >30 /[HPF] — AB (ref 0–2)

## 2023-08-14 LAB — URINALYSIS, ROUTINE W REFLEX MICROSCOPIC
Bilirubin, UA: NEGATIVE
Glucose, UA: NEGATIVE
Ketones, UA: NEGATIVE
Leukocytes,UA: NEGATIVE
Nitrite, UA: NEGATIVE
Protein,UA: NEGATIVE
Specific Gravity, UA: 1.02 (ref 1.005–1.030)
Urobilinogen, Ur: 0.2 mg/dL (ref 0.2–1.0)
pH, UA: 7 (ref 5.0–7.5)

## 2023-08-14 NOTE — Progress Notes (Signed)
   Assessment: 1. Elevated PSA     Plan: Today had a long discussion with the patient and his wife and reviewed the prostate pathology which was negative except for 1 core with ASAP. I discussed the implications of this finding and further recommendations. Will set up follow-up for approximately 6 months.  Patient will have a multiparametric prostate MRI and repeat PSA prior to visit.  Chief Complaint: Biopsy results  HPI: Henry Wallace is a 62 y.o. male who presents for continued evaluation of elevated PSA. Status post TRUS biopsy 08/07/2023-pathology negative for tumor; 1 core with focal ASAP Prostate volume = 44.7 PSA 05/2023 = 5.2  No issues or complications as a result of his biopsy.  Portions of the above documentation were copied from a prior visit for review purposes only.  Allergies: No Known Allergies  PMH: Past Medical History:  Diagnosis Date   Adenomatous colon polyp    Coronary artery disease    Diverticulosis    Hepatic steatosis    Hypertension    Migraines     PSH: Past Surgical History:  Procedure Laterality Date   ADENOIDECTOMY     ANKLE SURGERY Left 1994   CHOLECYSTECTOMY Left 07/06/2023   Procedure: LAPAROSCOPIC CHOLECYSTECTOMY WITH INTRAOPERATIVE CHOLANGIOGRAM;  Surgeon: Darnell Level, MD;  Location: [facility];  Service: General;  Laterality: Left;   HERNIA REPAIR     PROSTATE SURGERY     VASECTOMY      SH: Social History   Tobacco Use   Smoking status: Former    Types: Cigarettes   Smokeless tobacco: Never  Substance Use Topics   Alcohol use: Not Currently    Comment: occ   Drug use: No    ROS: Constitutional:  Negative for fever, chills, weight loss CV: Negative for chest pain, previous MI, hypertension Respiratory:  Negative for shortness of breath, wheezing, sleep apnea, frequent cough GI:  Negative for nausea, vomiting, bloody stool, GERD  PE: GENERAL APPEARANCE:  Well appearing, well developed, well nourished, NAD

## 2023-08-15 ENCOUNTER — Other Ambulatory Visit: Payer: Self-pay | Admitting: Family Medicine

## 2023-08-15 DIAGNOSIS — I10 Essential (primary) hypertension: Secondary | ICD-10-CM

## 2023-09-10 ENCOUNTER — Encounter: Payer: Self-pay | Admitting: Family Medicine

## 2023-09-13 DIAGNOSIS — L918 Other hypertrophic disorders of the skin: Secondary | ICD-10-CM | POA: Diagnosis not present

## 2023-11-12 ENCOUNTER — Encounter: Payer: Self-pay | Admitting: Family Medicine

## 2023-11-12 DIAGNOSIS — Z6834 Body mass index (BMI) 34.0-34.9, adult: Secondary | ICD-10-CM

## 2023-11-12 MED ORDER — TIRZEPATIDE-WEIGHT MANAGEMENT 5 MG/0.5ML ~~LOC~~ SOLN: 5.0000 mg | SUBCUTANEOUS | 3 refills | Status: DC

## 2023-11-12 NOTE — Telephone Encounter (Signed)
 Okay for RX?   I have pended the Rx to print so that it can be faxed to [facility].

## 2023-11-14 MED ORDER — TIRZEPATIDE-WEIGHT MANAGEMENT 5 MG/0.5ML ~~LOC~~ SOLN: 5.0000 mg | SUBCUTANEOUS | 3 refills | Status: DC

## 2023-11-14 NOTE — Addendum Note (Signed)
 Addended by: Abbe Amsterdam C on: 11/14/2023 03:10 PM   Modules accepted: Orders

## 2024-02-18 ENCOUNTER — Encounter: Payer: Self-pay | Admitting: Family Medicine

## 2024-03-27 ENCOUNTER — Encounter: Payer: Self-pay | Admitting: Family Medicine

## 2024-03-30 NOTE — Progress Notes (Unsigned)
 [facility] [address], [address] [place], [place] [address] [phone]  Date:  03/31/2024   Name:  Henry Wallace   DOB:  05-21-1961   MRN:  [id number]  PCP:  Watt Harlene BROCKS, MD    Chief Complaint: Sinus Problem   History of Present Illness:  Henry Wallace is a 63 y.o. very pleasant male patient who presents with the following:  Patient seen today with concern of pain in his right sided teeth and jaw. He messaged me about this on July 31 and we scheduled this appointment.  He has already been to see his dentist who evaluated him with both x-ray and ultrasound and did not find an explanation.  Patient also reports that the dentist did not see any issue with his sinus cavities.  He actually plans to see his dentist again tomorrow for cleaning He is planning a trip to [place]  in a few days and wants to be sure he will be good for the trip  Most recent routine visit with myself was in October History of hyperlipidemia, hypertension, carotid stenosis and elevated coronary calcium  score. Last year he was unfortunately quite ill with a prolonged bout of norovirus Of note he did test negative for C. difficile at that time-stool was positive for norovirus  Currently taking Crestor , tirzepatide  5 mg He has lost some weight and been able to come off of his high blood pressure medications  He is still having the discomfort in his jaw the night after he had a massage one week ago  He saw his dentist later in the week when he continued to have the sx  Nothing wrong with his teeth on x-rays-they did wonder if he might be having some nerve sensitivity However over the last few days he has noted more localazed pain in one lower tooth -the second to last molar on the lower right jaw  No new tinnitus-he does have some periodic tinnitus which is stable No CP or SOB   BP Readings from Last 3 Encounters:  03/31/24 110/80  08/07/23 (!)  153/97  07/06/23 135/80      Patient Active Problem List   Diagnosis Date Noted   Biliary dyskinesia 07/06/2023   Hyperlipidemia 07/05/2022   Elevated coronary artery calcium  score 07/05/2022   Carotid stenosis, asymptomatic, bilateral 10/18/2021   MDD (major depressive disorder), recurrent, in full remission (HCC) 11/04/2015   OBESITY, UNSPECIFIED 07/06/2009   HYPERTENSION, BENIGN 07/06/2009   CHEST PAIN, PRECORDIAL 07/06/2009    Past Medical History:  Diagnosis Date   Adenomatous colon polyp    Coronary artery disease    Diverticulosis    Hepatic steatosis    Hypertension    Migraines     Past Surgical History:  Procedure Laterality Date   ADENOIDECTOMY     ANKLE SURGERY Left 1994   CHOLECYSTECTOMY Left 07/06/2023   Procedure: LAPAROSCOPIC CHOLECYSTECTOMY WITH INTRAOPERATIVE CHOLANGIOGRAM;  Surgeon: Eletha Boas, MD;  Location: [facility];  Service: General;  Laterality: Left;   HERNIA REPAIR     PROSTATE SURGERY     VASECTOMY      Social History   Tobacco Use   Smoking status: Former    Types: Cigarettes   Smokeless tobacco: Never  Substance Use Topics   Alcohol use: Not Currently    Comment: occ   Drug use: No    Family History  Problem Relation Age of Onset   Diverticulitis Mother  Diabetes Maternal Grandmother    Lung cancer Maternal Grandfather    Heart disease Paternal Grandfather    Colon cancer Neg Hx    Stomach cancer Neg Hx    Esophageal cancer Neg Hx     No Known Allergies  Medication list has been reviewed and updated.  Current Outpatient Medications on File Prior to Visit  Medication Sig Dispense Refill   5-Hydroxytryptophan (5-HTP) 100 MG CAPS Take 200 mg by mouth every evening.     acetaminophen  (TYLENOL ) 500 MG tablet Take 500 mg by mouth every 6 (six) hours as needed for mild pain (pain score 1-3) or moderate pain (pain score 4-6).     amLODipine  (NORVASC ) 5 MG tablet Take 1 tablet (5 mg total) by mouth daily. 90 tablet 3    aspirin 81 MG chewable tablet Chew by mouth daily.     citalopram  (CELEXA ) 40 MG tablet Take 1 tablet (40 mg total) by mouth daily. 90 tablet 3   hydrochlorothiazide  (HYDRODIURIL ) 25 MG tablet Take 1 tablet by mouth daily.     multivitamin-iron-minerals-folic acid (CENTRUM) chewable tablet Chew 1 tablet by mouth daily.     rosuvastatin  (CRESTOR ) 10 MG tablet TAKE 1 TABLET(10 MG) BY MOUTH DAILY 90 tablet 3   tirzepatide  5 MG/0.5ML injection vial Inject 5 mg into the skin once a week. [facility] NPI: [id number] NCPDP: [id number].  Pt phone  [phone] 6 mL 3   No current facility-administered medications on file prior to visit.    Review of Systems:  As per HPI- otherwise negative.   Physical Examination: Vitals:   03/31/24 1352  BP: 110/80  Pulse: 63  Temp: 98 F (36.7 C)  SpO2: 96%   Vitals:   03/31/24 1352  Weight: 211 lb (95.7 kg)  Height: 5' 10.5 (1.791 m)   Body mass index is 29.85 kg/m. Ideal Body Weight: Weight in (lb) to have BMI = 25: 176.4  GEN: no acute distress.  Looks well HEENT: Atraumatic, Normocephalic.  Bilateral TM wnl, oropharynx normal.  PEERL,EOMI. I am not able to reproduce any tenderness on dental exam.  Teeth, gums, oral cavity all appear normal He does not have pain with pressure over the TMJ bilaterally Ears and Nose: No external deformity. CV: RRR, No M/G/R. No JVD. No thrill. No extra heart sounds. PULM: CTA B, no wheezes, crackles, rhonchi. No retractions. No resp. distress. No accessory muscle use. EXTR: No c/c/e PSYCH: Normally interactive. Conversant.    Assessment and Plan: Tooth pain - Plan: amoxicillin  (AMOXIL ) 875 MG tablet, predniSONE  (DELTASONE ) 20 MG tablet  Essential hypertension - Plan: amLODipine  (NORVASC ) 5 MG tablet, hydrochlorothiazide  (HYDRODIURIL ) 12.5 MG tablet  Patient seen today with tenderness in his right sided jaw, he notes it seems localized to 1 tooth in particular.  He saw his dentist  last week and had some x-rays, but he is actually going back tomorrow for recheck.  He is concerned because he is traveling to [place]  in about a week and wants to be sure he will be okay for the trip  Eisen will see what his dentist has to say tomorrow.  Unless his dentist can pinpoint another cause for his pain I advised him he can go ahead and take the course of amoxicillin  and prednisone  if desired.  He can also take this with him to [place]  if needed  He knows to keep me posted with any updates or questions  Signed Harlene Schroeder, MD

## 2024-03-31 ENCOUNTER — Ambulatory Visit (INDEPENDENT_AMBULATORY_CARE_PROVIDER_SITE_OTHER): Payer: Self-pay | Admitting: Family Medicine

## 2024-03-31 VITALS — BP 110/80 | HR 63 | Temp 98.0°F | Ht 70.5 in | Wt 211.0 lb

## 2024-03-31 DIAGNOSIS — J209 Acute bronchitis, unspecified: Secondary | ICD-10-CM

## 2024-03-31 DIAGNOSIS — I10 Essential (primary) hypertension: Secondary | ICD-10-CM

## 2024-03-31 DIAGNOSIS — K0889 Other specified disorders of teeth and supporting structures: Secondary | ICD-10-CM

## 2024-03-31 MED ORDER — PREDNISONE 20 MG PO TABS: ORAL_TABLET | ORAL | 0 refills | Status: DC

## 2024-03-31 MED ORDER — AMLODIPINE BESYLATE 5 MG PO TABS: 5.0000 mg | ORAL_TABLET | Freq: Every day | ORAL | 3 refills | Status: AC

## 2024-03-31 MED ORDER — HYDROCHLOROTHIAZIDE 12.5 MG PO TABS: 12.5000 mg | ORAL_TABLET | Freq: Every day | ORAL | 3 refills | Status: AC

## 2024-03-31 MED ORDER — AMOXICILLIN 875 MG PO TABS: 875.0000 mg | ORAL_TABLET | Freq: Two times a day (BID) | ORAL | 0 refills | Status: DC

## 2024-03-31 NOTE — Patient Instructions (Signed)
 Good to see you today- have fun in [place] !    Ok to use the amoxicillin  and the prednisone  is needed for your tooth pain- let me know if this is persisting  We will drop your BP meds/ hydrochlorothiazide  from 25 to 12.5 mg

## 2024-05-06 ENCOUNTER — Encounter: Payer: Self-pay | Admitting: Cardiovascular Disease

## 2024-05-06 ENCOUNTER — Ambulatory Visit: Attending: Cardiovascular Disease | Admitting: Cardiovascular Disease

## 2024-05-06 VITALS — BP 130/86 | HR 76 | Ht 70.5 in | Wt 204.0 lb

## 2024-05-06 DIAGNOSIS — I10 Essential (primary) hypertension: Secondary | ICD-10-CM

## 2024-05-06 DIAGNOSIS — E782 Mixed hyperlipidemia: Secondary | ICD-10-CM | POA: Diagnosis not present

## 2024-05-06 DIAGNOSIS — R931 Abnormal findings on diagnostic imaging of heart and coronary circulation: Secondary | ICD-10-CM

## 2024-05-06 NOTE — Assessment & Plan Note (Signed)
 History of hyperlipidemia on statin therapy with lipid profile performed 06/04/2023 revealing a total cholesterol 99, LDL 44 and HDL 38.

## 2024-05-06 NOTE — Patient Instructions (Signed)

## 2024-05-06 NOTE — Progress Notes (Signed)
 05/06/2024 Henry Wallace   04/14/1961  [id number]  Primary Physician Copland, Harlene BROCKS, MD Primary Cardiologist: Dorn JINNY Lesches MD [facility] [place], [place]  HPI:  Henry Wallace is a 63 y.o.  fit appearing married Caucasian male father of 1 son, grandfather 1 grandson referred by Dr. Jessica Koplan to be established my practice because of elevated coronary calcium  score. He is accompanied by his wife Henry Wallace today. She is a Tree surgeon.  I last saw him in the office 07/05/2022.  They owned renewal by [employer] throughout   but have since told them and are just recently retired.SABRA He is next-door neighbors to Dr. Jerel Schoff. His risk factors include family history with a father who died of myocardial infarction at age 85. He smoked remotely. He does have a history of hypertension and hyperlipidemia. He is never had a heart attack or stroke. He has had ocular migraines. He had a coronary calcium  score performed 06/09/2022 which was 610 distributed throughout 3 coronary arteries. Is very active, exercising daily basis , and is completely asymptomatic.   Since I saw him almost 2 years ago he did have a cholecystectomy and has lost 30 pounds.  He gave up eating red meat and has a Systems analyst.   Current Meds  Medication Sig   5-Hydroxytryptophan (5-HTP) 100 MG CAPS Take 200 mg by mouth every evening.   acetaminophen  (TYLENOL ) 500 MG tablet Take 500 mg by mouth every 6 (six) hours as needed for mild pain (pain score 1-3) or moderate pain (pain score 4-6).   amLODipine  (NORVASC ) 5 MG tablet Take 1 tablet (5 mg total) by mouth daily.   aspirin 81 MG chewable tablet Chew by mouth daily.   citalopram  (CELEXA ) 40 MG tablet Take 1 tablet (40 mg total) by mouth daily.   hydrochlorothiazide  (HYDRODIURIL ) 12.5 MG tablet Take 1 tablet (12.5 mg total) by mouth daily.   multivitamin-iron-minerals-folic acid (CENTRUM) chewable tablet Chew 1 tablet by mouth daily.    rosuvastatin  (CRESTOR ) 10 MG tablet TAKE 1 TABLET(10 MG) BY MOUTH DAILY   tirzepatide  5 MG/0.5ML injection vial Inject 5 mg into the skin once a week. [facility] NPI: [id number] NCPDP: [id number].  Pt phone  [phone]     No Known Allergies  Social History   Socioeconomic History   Marital status: Married    Spouse name: Not on file   Number of children: 1   Years of education: Not on file   Highest education level: Not on file  Occupational History   Occupation: Medical laboratory scientific officer  Tobacco Use   Smoking status: Former    Types: Cigarettes   Smokeless tobacco: Never  Substance and Sexual Activity   Alcohol use: Not Currently    Comment: occ   Drug use: No   Sexual activity: Yes  Other Topics Concern   Not on file  Social History Narrative   Married. Education: [employer].    Social Drivers of Corporate investment banker Strain: Not on file  Food Insecurity: Not on file  Transportation Needs: Not on file  Physical Activity: Not on file  Stress: Not on file  Social Connections: Not on file  Intimate Partner Violence: Not on file     Review of Systems: General: negative for chills, fever, night sweats or weight changes.  Cardiovascular: negative for chest pain, dyspnea on exertion, edema, orthopnea, palpitations, paroxysmal nocturnal dyspnea or shortness of breath Dermatological: negative for rash Respiratory:  negative for cough or wheezing Urologic: negative for hematuria Abdominal: negative for nausea, vomiting, diarrhea, bright red blood per rectum, melena, or hematemesis Neurologic: negative for visual changes, syncope, or dizziness All other systems reviewed and are otherwise negative except as noted above.    Blood pressure 130/86, pulse 76, height 5' 10.5 (1.791 m), weight 204 lb (92.5 kg), SpO2 97%.  General appearance: alert and no distress Neck: no adenopathy, no carotid bruit, no JVD, supple, symmetrical, trachea midline, and  thyroid  not enlarged, symmetric, no tenderness/mass/nodules Lungs: clear to auscultation bilaterally Heart: regular rate and rhythm, S1, S2 normal, no murmur, click, rub or gallop Extremities: extremities normal, atraumatic, no cyanosis or edema Pulses: 2+ and symmetric Skin: Skin color, texture, turgor normal. No rashes or lesions Neurologic: Grossly normal  EKG EKG Interpretation Date/Time:  Tuesday May 06 2024 10:56:47 EDT Ventricular Rate:  54 PR Interval:  164 QRS Duration:  100 QT Interval:  454 QTC Calculation: 430 R Axis:   50  Text Interpretation: Sinus bradycardia Incomplete right bundle branch block When compared with ECG of 16-Jun-2023 13:43, PREVIOUS ECG IS PRESENT Confirmed by Court Carrier [phone]) on 05/06/2024 10:58:01 AM    ASSESSMENT AND PLAN:   HYPERTENSION, BENIGN History of essential hypertension with blood pressure measured today at 130/86.  He is on amlodipine  and HydroDIURIL .  Hyperlipidemia History of hyperlipidemia on statin therapy with lipid profile performed 06/04/2023 revealing a total cholesterol 99, LDL 44 and HDL 38.  Elevated coronary artery calcium  score History of elevated coronary calcium  score of 610 distributed through all 3 coronary arteries.  He is active and asymptomatic and at goal for secondary prevention.     Carrier DOROTHA Court MD FACP,FACC,FAHA, [facility] 05/06/2024 11:22 AM

## 2024-05-06 NOTE — Assessment & Plan Note (Signed)
 History of essential hypertension with blood pressure measured today at 130/86.  He is on amlodipine  and HydroDIURIL .

## 2024-05-06 NOTE — Assessment & Plan Note (Signed)
 History of elevated coronary calcium  score of 610 distributed through all 3 coronary arteries.  He is active and asymptomatic and at goal for secondary prevention.

## 2024-06-03 NOTE — Patient Instructions (Incomplete)
 It was great to see you again today, I will be in touch with your labs

## 2024-06-03 NOTE — Progress Notes (Signed)
 [facility] [address] [place], [place] [address] [phone]  Date:  06/04/2024   Name:  Henry Wallace   DOB:  07-28-1961   MRN:  [id number]  PCP:  Watt Harlene BROCKS, MD    Chief Complaint: No chief complaint on file.   History of Present Illness:  Henry Wallace is a 63 y.o. very pleasant male patient who presents with the following:  Patient seen today for physical exam.  I saw him most recently in August when he was having some tooth and jaw pain History of hyperlipidemia, hypertension, minimal carotid stenosis and elevated coronary calcium  score. Last year he was unfortunately quite ill with a prolonged bout of norovirus Of note he did test negative for C. difficile at that time-stool was positive for norovirus   Currently taking Crestor , tirzepatide  5 mg He has lost some weight and been able to reduce his high blood pressure medications  His cardiologist is Dr. Wadie, he was seen just recently-about 1 month ago for follow-up.  They noted he was doing fine, blood pressure under good control and taking his statin.  Nothing further needed right now from a cardiology standpoint  There was a question of PSA elevation last year, he did have a biopsy in December 2024-clarify with patient what the follow-up plan is  Tetanus booster is due Flu vaccine Recommend COVID booster Can get pneumonia vaccine Shingrix is complete And update labs today  Discussed the use of AI scribe software for clinical note transcription with the patient, who gave verbal consent to proceed.  History of Present Illness   Lab Results  Component Value Date   PSA 5.20 (H) 06/04/2023   PSA 4.41 (H) 02/28/2023   PSA 3.28 10/16/2022     Patient Active Problem List   Diagnosis Date Noted   Biliary dyskinesia 07/06/2023   Hyperlipidemia 07/05/2022   Elevated coronary artery calcium  score 07/05/2022   Carotid stenosis,  asymptomatic, bilateral 10/18/2021   MDD (major depressive disorder), recurrent, in full remission 11/04/2015   OBESITY, UNSPECIFIED 07/06/2009   HYPERTENSION, BENIGN 07/06/2009   CHEST PAIN, PRECORDIAL 07/06/2009    Past Medical History:  Diagnosis Date   Adenomatous colon polyp    Coronary artery disease    Diverticulosis    Hepatic steatosis    Hypertension    Migraines     Past Surgical History:  Procedure Laterality Date   ADENOIDECTOMY     ANKLE SURGERY Left 1994   CHOLECYSTECTOMY Left 07/06/2023   Procedure: LAPAROSCOPIC CHOLECYSTECTOMY WITH INTRAOPERATIVE CHOLANGIOGRAM;  Surgeon: Eletha Boas, MD;  Location: [facility];  Service: General;  Laterality: Left;   HERNIA REPAIR     PROSTATE SURGERY     VASECTOMY      Social History   Tobacco Use   Smoking status: Former    Types: Cigarettes   Smokeless tobacco: Never  Substance Use Topics   Alcohol use: Not Currently    Comment: occ   Drug use: No    Family History  Problem Relation Age of Onset   Diverticulitis Mother    Diabetes Maternal Grandmother    Lung cancer Maternal Grandfather    Heart disease Paternal Grandfather    Colon cancer Neg Hx    Stomach cancer Neg Hx    Esophageal cancer Neg Hx     No Known Allergies  Medication list has been reviewed and updated.  Current Outpatient Medications on File  Prior to Visit  Medication Sig Dispense Refill   5-Hydroxytryptophan (5-HTP) 100 MG CAPS Take 200 mg by mouth every evening.     acetaminophen  (TYLENOL ) 500 MG tablet Take 500 mg by mouth every 6 (six) hours as needed for mild pain (pain score 1-3) or moderate pain (pain score 4-6).     amLODipine  (NORVASC ) 5 MG tablet Take 1 tablet (5 mg total) by mouth daily. 90 tablet 3   aspirin 81 MG chewable tablet Chew by mouth daily.     citalopram  (CELEXA ) 40 MG tablet Take 1 tablet (40 mg total) by mouth daily. 90 tablet 3   hydrochlorothiazide  (HYDRODIURIL ) 12.5 MG tablet Take 1 tablet (12.5 mg total) by mouth  daily. 90 tablet 3   multivitamin-iron-minerals-folic acid (CENTRUM) chewable tablet Chew 1 tablet by mouth daily.     rosuvastatin  (CRESTOR ) 10 MG tablet TAKE 1 TABLET(10 MG) BY MOUTH DAILY 90 tablet 3   tirzepatide  5 MG/0.5ML injection vial Inject 5 mg into the skin once a week. [facility] NPI: [id number] NCPDP: [id number].  Pt phone  [phone] 6 mL 3   No current facility-administered medications on file prior to visit.    Review of Systems:  As per HPI- otherwise negative.   Physical Examination: There were no vitals filed for this visit. There were no vitals filed for this visit. There is no height or weight on file to calculate BMI. Ideal Body Weight:    GEN: no acute distress. HEENT: Atraumatic, Normocephalic.  Ears and Nose: No external deformity. CV: RRR, No M/G/R. No JVD. No thrill. No extra heart sounds. PULM: CTA B, no wheezes, crackles, rhonchi. No retractions. No resp. distress. No accessory muscle use. ABD: S, NT, ND, +BS. No rebound. No HSM. EXTR: No c/c/e PSYCH: Normally interactive. Conversant.    Assessment and Plan: No diagnosis found.  Assessment & Plan   Signed Harlene Schroeder, MD

## 2024-06-04 ENCOUNTER — Encounter: Payer: Self-pay | Admitting: Family Medicine

## 2024-06-04 ENCOUNTER — Ambulatory Visit (INDEPENDENT_AMBULATORY_CARE_PROVIDER_SITE_OTHER): Payer: BC Managed Care – PPO | Admitting: Family Medicine

## 2024-06-04 VITALS — BP 122/70 | HR 61 | Temp 98.4°F | Ht 70.5 in | Wt 212.6 lb

## 2024-06-04 DIAGNOSIS — I6523 Occlusion and stenosis of bilateral carotid arteries: Secondary | ICD-10-CM | POA: Diagnosis not present

## 2024-06-04 DIAGNOSIS — E782 Mixed hyperlipidemia: Secondary | ICD-10-CM

## 2024-06-04 DIAGNOSIS — I10 Essential (primary) hypertension: Secondary | ICD-10-CM | POA: Diagnosis not present

## 2024-06-04 DIAGNOSIS — R972 Elevated prostate specific antigen [PSA]: Secondary | ICD-10-CM

## 2024-06-04 DIAGNOSIS — Z131 Encounter for screening for diabetes mellitus: Secondary | ICD-10-CM

## 2024-06-04 DIAGNOSIS — Z23 Encounter for immunization: Secondary | ICD-10-CM | POA: Diagnosis not present

## 2024-06-04 DIAGNOSIS — E66811 Obesity, class 1: Secondary | ICD-10-CM

## 2024-06-04 DIAGNOSIS — Z130 Encounter for screening for diseases of the blood and blood-forming organs and certain disorders involving the immune mechanism: Secondary | ICD-10-CM

## 2024-06-04 DIAGNOSIS — Z0000 Encounter for general adult medical examination without abnormal findings: Secondary | ICD-10-CM

## 2024-06-04 DIAGNOSIS — Z6834 Body mass index (BMI) 34.0-34.9, adult: Secondary | ICD-10-CM

## 2024-06-04 DIAGNOSIS — F325 Major depressive disorder, single episode, in full remission: Secondary | ICD-10-CM

## 2024-06-04 DIAGNOSIS — E785 Hyperlipidemia, unspecified: Secondary | ICD-10-CM

## 2024-06-04 LAB — COMPREHENSIVE METABOLIC PANEL WITH GFR
ALT: 22 U/L (ref 0–53)
AST: 24 U/L (ref 0–37)
Albumin: 5 g/dL (ref 3.5–5.2)
Alkaline Phosphatase: 71 U/L (ref 39–117)
BUN: 16 mg/dL (ref 6–23)
CO2: 31 meq/L (ref 19–32)
Calcium: 9.5 mg/dL (ref 8.4–10.5)
Chloride: 102 meq/L (ref 96–112)
Creatinine, Ser: 1.13 mg/dL (ref 0.40–1.50)
GFR: 68.5 mL/min (ref >60.00)
Glucose, Bld: 110 mg/dL — ABNORMAL HIGH (ref 70–99)
Potassium: 4.1 meq/L (ref 3.5–5.1)
Sodium: 142 meq/L (ref 135–145)
Total Bilirubin: 0.6 mg/dL (ref 0.2–1.2)
Total Protein: 7.1 g/dL (ref 6.0–8.3)

## 2024-06-04 LAB — LIPID PANEL
Cholesterol: 121 mg/dL (ref 0–200)
HDL: 43.6 mg/dL (ref >39.00)
LDL Cholesterol: 61 mg/dL (ref 0–99)
NonHDL: 77.6
Total CHOL/HDL Ratio: 3
Triglycerides: 81 mg/dL (ref 0.0–149.0)
VLDL: 16.2 mg/dL (ref 0.0–40.0)

## 2024-06-04 LAB — CBC
HCT: 45.9 % (ref 39.0–52.0)
Hemoglobin: 15.5 g/dL (ref 13.0–17.0)
MCHC: 33.8 g/dL (ref 30.0–36.0)
MCV: 93.1 fl (ref 78.0–100.0)
Platelets: 262 K/uL (ref 150.0–400.0)
RBC: 4.93 Mil/uL (ref 4.22–5.81)
RDW: 13.4 % (ref 11.5–15.5)
WBC: 5.8 K/uL (ref 4.0–10.5)

## 2024-06-04 LAB — HEMOGLOBIN A1C: Hgb A1c MFr Bld: 5.4 % (ref 4.6–6.5)

## 2024-06-04 LAB — PSA: PSA: 3.47 ng/mL (ref 0.10–4.00)

## 2024-06-04 MED ORDER — CITALOPRAM HYDROBROMIDE 40 MG PO TABS: 40.0000 mg | ORAL_TABLET | Freq: Every day | ORAL | 3 refills | Status: DC

## 2024-06-04 MED ORDER — TIRZEPATIDE-WEIGHT MANAGEMENT 5 MG/0.5ML ~~LOC~~ SOLN: 5.0000 mg | SUBCUTANEOUS | 3 refills | Status: AC

## 2024-06-04 MED ORDER — ROSUVASTATIN CALCIUM 5 MG PO TABS: 5.0000 mg | ORAL_TABLET | Freq: Every day | ORAL | 3 refills | Status: AC

## 2024-07-31 DIAGNOSIS — H04123 Dry eye syndrome of bilateral lacrimal glands: Secondary | ICD-10-CM | POA: Diagnosis not present

## 2024-07-31 DIAGNOSIS — H43813 Vitreous degeneration, bilateral: Secondary | ICD-10-CM | POA: Diagnosis not present

## 2024-08-13 DIAGNOSIS — H903 Sensorineural hearing loss, bilateral: Secondary | ICD-10-CM | POA: Diagnosis not present

## 2024-08-22 ENCOUNTER — Other Ambulatory Visit: Payer: Self-pay | Admitting: Family Medicine

## 2024-08-22 DIAGNOSIS — F325 Major depressive disorder, single episode, in full remission: Secondary | ICD-10-CM

## 2024-12-08 ENCOUNTER — Ambulatory Visit: Admitting: Family Medicine

## 2025-06-08 ENCOUNTER — Encounter: Admitting: Family Medicine
